# Patient Record
Sex: Male | Born: 1975 | Race: White | Hispanic: No | Marital: Married | Smoking: Former smoker
Health system: Southern US, Community
[De-identification: ages and names within clinical notes are randomized; demographics above are authoritative.]

## PROBLEM LIST (undated history)

## (undated) DIAGNOSIS — B019 Varicella without complication: Secondary | ICD-10-CM

## (undated) DIAGNOSIS — K589 Irritable bowel syndrome without diarrhea: Secondary | ICD-10-CM

## (undated) DIAGNOSIS — E785 Hyperlipidemia, unspecified: Secondary | ICD-10-CM

## (undated) DIAGNOSIS — G8929 Other chronic pain: Secondary | ICD-10-CM

## (undated) DIAGNOSIS — R011 Cardiac murmur, unspecified: Secondary | ICD-10-CM

## (undated) DIAGNOSIS — I4891 Unspecified atrial fibrillation: Secondary | ICD-10-CM

## (undated) DIAGNOSIS — M25579 Pain in unspecified ankle and joints of unspecified foot: Secondary | ICD-10-CM

## (undated) HISTORY — DX: Unspecified atrial fibrillation: I48.91

## (undated) HISTORY — DX: Cardiac murmur, unspecified: R01.1

## (undated) HISTORY — DX: Varicella without complication: B01.9

## (undated) HISTORY — DX: Other chronic pain: G89.29

## (undated) HISTORY — DX: Hyperlipidemia, unspecified: E78.5

## (undated) HISTORY — DX: Irritable bowel syndrome, unspecified: K58.9

## (undated) HISTORY — DX: Pain in unspecified ankle and joints of unspecified foot: M25.579

---
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
## 2015-03-02 ENCOUNTER — Encounter: Payer: Self-pay | Admitting: Primary Care

## 2015-03-02 ENCOUNTER — Ambulatory Visit (INDEPENDENT_AMBULATORY_CARE_PROVIDER_SITE_OTHER): Payer: BLUE CROSS/BLUE SHIELD | Admitting: Primary Care

## 2015-03-02 VITALS — BP 118/76 | HR 71 | Temp 98.0°F | Ht 71.0 in | Wt 237.8 lb

## 2015-03-02 DIAGNOSIS — Z7189 Other specified counseling: Secondary | ICD-10-CM | POA: Diagnosis not present

## 2015-03-02 DIAGNOSIS — Z7689 Persons encountering health services in other specified circumstances: Secondary | ICD-10-CM

## 2015-03-02 NOTE — Patient Instructions (Signed)
Please schedule a physical with me within the next 3 months. You may also schedule a lab only appointment 3-4 days prior. We will discuss your lab results in detail during your physical.  Your arm pain is likely caused by repetitive movement. The tendons can become inflamed causing irritation. Rest and try ibuprofen for pain and inflammation. Take 600 mg of ibuprofen three times daily as needed. Please notify me if pain is not improved or becomes worse.  It was a pleasure to meet you today! Please don't hesitate to call me with any questions. Welcome to [facility]!

## 2015-03-02 NOTE — Progress Notes (Signed)
Pre visit review using our clinic review tool, if applicable. No additional management support is needed unless otherwise documented below in the visit note. 

## 2015-03-02 NOTE — Progress Notes (Signed)
Subjective:    Patient ID: Stephen Sweeney, male    DOB: 1975-08-29, 40 y.o.   MRN: [id number]  HPI  Stephen Sweeney is a 40 year old male who presents today to establish care and discuss the problems mentioned below. Will obtain old records. His last physical was several years ago.   1) Heart Arrhythmia: 1 episode of Atrial Fibrillation 8 years ago. Diagnosed by prior PCP which showed atrial fibrillation on ECG. He has since stopped smoking, he does drink soda (overall reduction), and has reduced his coffee consumption to two large cups per day (from 2 pots daily). Also with heart murmur that was diagnosed during his Atrial Fibrillation years ago.  2) Arm Pain: Located to right anterior forearm. He notices his pain mostly after placing his arm in a certain position, usually in the flexed position, for prolong periods of time (playing with his phone, using the mouse on his computer). He mostly notices the pain when he extends the arm after long periods of flexion.   Denies numbness/tingling, recent injury or trauma. His pain has been present the past 2-3 months. He's used a TENS unit without improvement. He does take occasional aleve with slight reduction in his pain.   Review of Systems  Constitutional: Negative for unexpected weight change.  HENT: Negative for rhinorrhea.   Respiratory: Negative for cough and shortness of breath.   Cardiovascular: Negative for chest pain and palpitations.  Gastrointestinal: Negative for diarrhea and constipation.  Musculoskeletal:       Right arm pain, stiffness to pain  Neurological: Negative for dizziness, numbness and headaches.  Psychiatric/Behavioral:       Denies concerns for anxiety or depression       Past Medical History  Diagnosis Date  . Heart murmur   . Chickenpox   . Atrial fibrillation (HCC)   . IBS (irritable bowel syndrome)     Mixed    Social History   Social History  . Marital Status: Married    Spouse Name: N/A  . Number of  Children: N/A  . Years of Education: N/A   Occupational History  . Not on file.   Social History Main Topics  . Smoking status: Current Every Day Smoker  . Smokeless tobacco: Not on file  . Alcohol Use: 0.0 oz/week    0 Standard drinks or equivalent per week     Comment: rarely  . Drug Use: No  . Sexual Activity: Not on file   Other Topics Concern  . Not on file   Social History Narrative   Married.   2 children.   Works in Consulting civil engineer.   Enjoys playing video games.       Past Surgical History  Procedure Laterality Date  . Tonsillectomy and adenoidectomy  1985    Family History  Problem Relation Age of Onset  . Hyperlipidemia Mother   . Hypertension Mother   . Depression Mother   . Diabetes Mother   . Arthritis Father   . Hyperlipidemia Father   . Heart disease Father   . Stroke Father   . Depression Brother   . Hyperlipidemia Maternal Grandmother   . Hypertension Maternal Grandmother   . Depression Maternal Grandmother   . Hyperlipidemia Maternal Grandfather   . Stroke Maternal Grandfather   . Hypertension Maternal Grandfather   . Hyperlipidemia Paternal Grandmother   . Stroke Paternal Grandmother   . Alcohol abuse Paternal Grandfather   . Arthritis Paternal Grandfather   . Hyperlipidemia Paternal Grandfather   .  Hypertension Paternal Grandfather     No Known Allergies  No current outpatient prescriptions on file prior to visit.   No current facility-administered medications on file prior to visit.    BP 118/76 mmHg  Pulse 71  Temp(Src) 98 F (36.7 C) (Oral)  Ht  (1.803 m)  Wt 237 lb 12.8 oz (107.865 kg)  BMI 33.18 kg/m2  SpO2 98%    Objective:   Physical Exam  Constitutional: He appears well-nourished.  Cardiovascular: Normal rate and regular rhythm.   Pulmonary/Chest: Effort normal and breath sounds normal.  Musculoskeletal: Normal range of motion. He exhibits no edema.  Skin: Skin is warm and dry.  Psychiatric: He has a normal mood and  affect.          Assessment & Plan:  Arm Pain:  Suspect tendonitis. Located to anterior forearm, only after extension after long periods of flexing (hold phone, etc.) Good ROM, mildly tender upon palpation, no erythema. Will trial short term NSAIDS and have patient rest forearm if possible. He will notify me if symptoms become worse or do not improvement

## 2015-03-21 ENCOUNTER — Other Ambulatory Visit: Payer: Self-pay | Admitting: Primary Care

## 2015-03-21 ENCOUNTER — Other Ambulatory Visit (INDEPENDENT_AMBULATORY_CARE_PROVIDER_SITE_OTHER): Payer: BLUE CROSS/BLUE SHIELD

## 2015-03-21 DIAGNOSIS — Z0000 Encounter for general adult medical examination without abnormal findings: Secondary | ICD-10-CM | POA: Diagnosis not present

## 2015-03-21 LAB — COMPREHENSIVE METABOLIC PANEL
ALT: 62 U/L — AB (ref 0–53)
AST: 31 U/L (ref 0–37)
Albumin: 4.8 g/dL (ref 3.5–5.2)
Alkaline Phosphatase: 49 U/L (ref 39–117)
BILIRUBIN TOTAL: 0.9 mg/dL (ref 0.2–1.2)
BUN: 17 mg/dL (ref 6–23)
CHLORIDE: 104 meq/L (ref 96–112)
CO2: 28 meq/L (ref 19–32)
Calcium: 9.9 mg/dL (ref 8.4–10.5)
Creatinine, Ser: 1.05 mg/dL (ref 0.40–1.50)
GFR: 82.67 mL/min (ref >60.00)
GLUCOSE: 90 mg/dL (ref 70–99)
Potassium: 4.6 mEq/L (ref 3.5–5.1)
Sodium: 140 mEq/L (ref 135–145)
Total Protein: 7.3 g/dL (ref 6.0–8.3)

## 2015-03-21 LAB — LIPID PANEL
Cholesterol: 210 mg/dL — ABNORMAL HIGH (ref 0–200)
HDL: 41.1 mg/dL (ref >39.00)
LDL CALC: 130 mg/dL — AB (ref 0–99)
NONHDL: 168.63
Total CHOL/HDL Ratio: 5
Triglycerides: 194 mg/dL — ABNORMAL HIGH (ref 0.0–149.0)
VLDL: 38.8 mg/dL (ref 0.0–40.0)

## 2015-03-21 LAB — HEMOGLOBIN A1C: Hgb A1c MFr Bld: 4.9 % (ref 4.6–6.5)

## 2015-03-27 ENCOUNTER — Encounter: Payer: Self-pay | Admitting: Primary Care

## 2015-03-27 ENCOUNTER — Ambulatory Visit (INDEPENDENT_AMBULATORY_CARE_PROVIDER_SITE_OTHER): Payer: BLUE CROSS/BLUE SHIELD | Admitting: Primary Care

## 2015-03-27 VITALS — BP 120/78 | HR 68 | Temp 98.0°F | Ht 71.0 in | Wt 239.1 lb

## 2015-03-27 DIAGNOSIS — Z0000 Encounter for general adult medical examination without abnormal findings: Secondary | ICD-10-CM | POA: Diagnosis not present

## 2015-03-27 DIAGNOSIS — E785 Hyperlipidemia, unspecified: Secondary | ICD-10-CM

## 2015-03-27 DIAGNOSIS — Z23 Encounter for immunization: Secondary | ICD-10-CM

## 2015-03-27 DIAGNOSIS — R945 Abnormal results of liver function studies: Secondary | ICD-10-CM

## 2015-03-27 DIAGNOSIS — R7989 Other specified abnormal findings of blood chemistry: Secondary | ICD-10-CM

## 2015-03-27 NOTE — Progress Notes (Signed)
Pre visit review using our clinic review tool, if applicable. No additional management support is needed unless otherwise documented below in the visit note. 

## 2015-03-27 NOTE — Addendum Note (Signed)
Addended by: Tawnya CrookSAMBATH, Corry Ihnen on: 03/27/2015 09:17 AM   Modules accepted: Orders, SmartSet

## 2015-03-27 NOTE — Assessment & Plan Note (Signed)
Td due, provided today. Too late for flu. Discussed the importance of a healthy diet and regular exercise in order for weight loss and to reduce risk of other medical diseases.  Labs with hyperlipidemia that's discussed elsewhere.  Exam unremarkable.  Will have him schedule lab only appointment in 6 months for re-evaluation of lipids and LFT's.  Follow up in 1 year for repeat physical.

## 2015-03-27 NOTE — Patient Instructions (Signed)
It is important that you improve your diet. Please limit fast food, fatty foods, sodas, etc. Increase your consumption of fresh fruits and vegetables.  You need to consume about 2 liters (64 ounces) of water daily.  You've been provided with a tetanus vaccination that will cover you for 10 years.  Start exercising. You should be getting 1 hour of moderate intensity exercise 5 days weekly.  Increase Fish Oil to 3000 mg daily to help lower triglycerides.   Schedule a lab only appointment in 6 months for re-evaluation of cholesterol and a liver function.   Follow up in 1 year for repeat physical, or sooner if needed.  It was a pleasure to see you today!  Fat and Cholesterol Restricted Diet High levels of fat and cholesterol in your blood may lead to various health problems, such as diseases of the heart, blood vessels, gallbladder, liver, and pancreas. Fats are concentrated sources of energy that come in various forms. Certain types of fat, including saturated fat, may be harmful in excess. Cholesterol is a substance needed by your body in small amounts. Your body makes all the cholesterol it needs. Excess cholesterol comes from the food you eat. When you have high levels of cholesterol and saturated fat in your blood, health problems can develop because the excess fat and cholesterol will gather along the walls of your blood vessels, causing them to narrow. Choosing the right foods will help you control your intake of fat and cholesterol. This will help keep the levels of these substances in your blood within normal limits and reduce your risk of disease. WHAT IS MY PLAN? Your health care provider recommends that you:  Get no more than __________ % of the total calories in your daily diet from fat.  Limit your intake of saturated fat to less than ______% of your total calories each day.  Limit the amount of cholesterol in your diet to less than _________mg per day. WHAT TYPES OF FAT SHOULD I  CHOOSE?  Choose healthy fats more often. Choose monounsaturated and polyunsaturated fats, such as olive and canola oil, flaxseeds, walnuts, almonds, and seeds.  Eat more omega-3 fats. Good choices include salmon, mackerel, sardines, tuna, flaxseed oil, and ground flaxseeds. Aim to eat fish at least two times a week.  Limit saturated fats. Saturated fats are primarily found in animal products, such as meats, butter, and cream. Plant sources of saturated fats include palm oil, palm kernel oil, and coconut oil.  Avoid foods with partially hydrogenated oils in them. These contain trans fats. Examples of foods that contain trans fats are stick margarine, some tub margarines, cookies, crackers, and other baked goods. WHAT GENERAL GUIDELINES DO I NEED TO FOLLOW? These guidelines for healthy eating will help you control your intake of fat and cholesterol:  Check food labels carefully to identify foods with trans fats or high amounts of saturated fat.  Fill one half of your plate with vegetables and green salads.  Fill one fourth of your plate with whole grains. Look for the word "whole" as the first word in the ingredient list.  Fill one fourth of your plate with lean protein foods.  Limit fruit to two servings a day. Choose fruit instead of juice.  Eat more foods that contain soluble fiber. Examples of foods that contain this type of fiber are apples, broccoli, carrots, beans, peas, and barley. Aim to get 20-30 g of fiber per day.  Eat more home-cooked food and less restaurant, buffet, and fast  food.  Limit or avoid alcohol.  Limit foods high in starch and sugar.  Limit fried foods.  Cook foods using methods other than frying. Baking, boiling, grilling, and broiling are all great options.  Lose weight if you are overweight. Losing just 5-10% of your initial body weight can help your overall health and prevent diseases such as diabetes and heart disease. WHAT FOODS CAN I  EAT? Grains Whole grains, such as whole wheat or whole grain breads, crackers, cereals, and pasta. Unsweetened oatmeal, bulgur, barley, quinoa, or brown rice. Corn or whole wheat flour tortillas. Vegetables Fresh or frozen vegetables (raw, steamed, roasted, or grilled). Green salads. Fruits All fresh, canned (in natural juice), or frozen fruits. Meat and Other Protein Products Ground beef (85% or leaner), grass-fed beef, or beef trimmed of fat. Skinless chicken or [place]. Ground chicken or [place]. Pork trimmed of fat. All fish and seafood. Eggs. Dried beans, peas, or lentils. Unsalted nuts or seeds. Unsalted canned or dry beans. Dairy Low-fat dairy products, such as skim or 1% milk, 2% or reduced-fat cheeses, low-fat ricotta or cottage cheese, or plain low-fat yogurt. Fats and Oils Tub margarines without trans fats. Light or reduced-fat mayonnaise and salad dressings. Avocado. Olive, canola, sesame, or safflower oils. Natural peanut or almond butter (choose ones without added sugar and oil). The items listed above may not be a complete list of recommended foods or beverages. Contact your dietitian for more options. WHAT FOODS ARE NOT RECOMMENDED? Grains White bread. White pasta. White rice. Cornbread. Bagels, pastries, and croissants. Crackers that contain trans fat. Vegetables White potatoes. Corn. Creamed or fried vegetables. Vegetables in a cheese sauce. Fruits Dried fruits. Canned fruit in light or heavy syrup. Fruit juice. Meat and Other Protein Products Fatty cuts of meat. Ribs, chicken wings, bacon, sausage, bologna, salami, chitterlings, fatback, hot dogs, bratwurst, and packaged luncheon meats. Liver and organ meats. Dairy Whole or 2% milk, cream, half-and-half, and cream cheese. Whole milk cheeses. Whole-fat or sweetened yogurt. Full-fat cheeses. Nondairy creamers and whipped toppings. Processed cheese, cheese spreads, or cheese curds. Sweets and Desserts Corn syrup, sugars,  honey, and molasses. Candy. Jam and jelly. Syrup. Sweetened cereals. Cookies, pies, cakes, donuts, muffins, and ice cream. Fats and Oils Butter, stick margarine, lard, shortening, ghee, or bacon fat. Coconut, palm kernel, or palm oils. Beverages Alcohol. Sweetened drinks (such as sodas, lemonade, and fruit drinks or punches). The items listed above may not be a complete list of foods and beverages to avoid. Contact your dietitian for more information.   This information is not intended to replace advice given to you by your health care provider. Make sure you discuss any questions you have with your health care provider.   Document Released: 12/24/2004 Document Revised: 01/14/2014 Document Reviewed: 03/24/2013 Elsevier Interactive Patient Education [employer]

## 2015-03-27 NOTE — Progress Notes (Signed)
Subjective:    Patient ID: Stephen Sweeney, male    DOB: August 28, 1975, 40 y.o.   MRN: [id number]  HPI  Stephen Sweeney is a 40 year old who presents today for complete physical.  Immunizations: -Tetanus: Unsure, believes it's been over 10 years.  -Influenza: Did not receive last season.    Diet: Endorses a fair diet. Breakfast: Skips Lunch: Salad, wrap, fast food, chili Dinner: Pasta, casseroles, lean meat, occasional vegetables Snacks: Chips, candy, doughnut  Desserts: Eats 4 times weekly Beverages: Coffee, soda, unsweet tea, limited water.   Exercise: He is not currently exercising, but plans on walking more at work.  Eye exam: Completed in May 2016 Dental exam: Completed in March 2017     Review of Systems  Constitutional: Negative for unexpected weight change.  HENT: Negative for rhinorrhea.   Respiratory: Negative for cough and shortness of breath.   Cardiovascular: Negative for chest pain.  Gastrointestinal: Negative for diarrhea and constipation.  Genitourinary: Negative for difficulty urinating.  Musculoskeletal:       Right forearm pain. Overuse of right forearm.   Skin: Negative for rash.  Allergic/Immunologic: Negative for immunocompromised state.  Neurological: Negative for dizziness, numbness and headaches.  Psychiatric/Behavioral:       Denies concerns for anxiety or depression.        Past Medical History  Diagnosis Date  . Heart murmur   . Chickenpox   . Atrial fibrillation (HCC)     once, diagnosed on ECG  . IBS (irritable bowel syndrome)     Mixed    Social History   Social History  . Marital Status: Married    Spouse Name: N/A  . Number of Children: N/A  . Years of Education: N/A   Occupational History  . Not on file.   Social History Main Topics  . Smoking status: Former Games developermoker  . Smokeless tobacco: Not on file  . Alcohol Use: 0.0 oz/week    0 Standard drinks or equivalent per week     Comment: rarely  . Drug Use: No  . Sexual  Activity: Not on file   Other Topics Concern  . Not on file   Social History Narrative   Married.   2 children.   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       Past Surgical History  Procedure Laterality Date  . Tonsillectomy and adenoidectomy  1985    Family History  Problem Relation Age of Onset  . Hyperlipidemia Mother   . Hypertension Mother   . Depression Mother   . Diabetes Mother   . Arthritis Father   . Hyperlipidemia Father   . Heart disease Father   . Stroke Father   . Depression Brother   . Hyperlipidemia Maternal Grandmother   . Hypertension Maternal Grandmother   . Depression Maternal Grandmother   . Hyperlipidemia Maternal Grandfather   . Stroke Maternal Grandfather   . Hypertension Maternal Grandfather   . Hyperlipidemia Paternal Grandmother   . Stroke Paternal Grandmother   . Alcohol abuse Paternal Grandfather   . Arthritis Paternal Grandfather   . Hyperlipidemia Paternal Grandfather   . Hypertension Paternal Grandfather     No Known Allergies  Current Outpatient Prescriptions on File Prior to Visit  Medication Sig Dispense Refill  . fexofenadine (ALLEGRA) 180 MG tablet Take 180 mg by mouth daily.    . Fish Oil OIL Take 2,000 mg by mouth daily.    . Multiple Vitamin (MULTIVITAMIN) capsule Take 1  capsule by mouth daily.     No current facility-administered medications on file prior to visit.    BP 120/78 mmHg  Pulse 68  Temp(Src) 98 F (36.7 C) (Oral)  Ht  (1.803 m)  Wt 239 lb 1.9 oz (108.464 kg)  BMI 33.37 kg/m2  SpO2 97%    Objective:   Physical Exam  Constitutional: He is oriented to person, place, and time. He appears well-nourished.  HENT:  Right Ear: Tympanic membrane and ear canal normal.  Left Ear: Tympanic membrane and ear canal normal.  Nose: Nose normal. Right sinus exhibits no maxillary sinus tenderness and no frontal sinus tenderness. Left sinus exhibits no maxillary sinus tenderness and no frontal sinus  tenderness.  Mouth/Throat: Oropharynx is clear and moist.  Eyes: Conjunctivae and EOM are normal. Pupils are equal, round, and reactive to light.  Neck: Neck supple. No thyromegaly present.  Cardiovascular: Normal rate, regular rhythm and normal heart sounds.   Pulmonary/Chest: Effort normal and breath sounds normal. He has no wheezes. He has no rales.  Abdominal: Soft. Bowel sounds are normal. There is no tenderness.  Musculoskeletal: Normal range of motion.  Neurological: He is alert and oriented to person, place, and time. He has normal reflexes. No cranial nerve deficit.  Skin: Skin is warm and dry.  Psychiatric: He has a normal mood and affect.          Assessment & Plan:

## 2015-03-27 NOTE — Assessment & Plan Note (Signed)
TC and trigs above goal. LDL borderline. Will give him an opportunity to improve his diet and start exercising. Increase fish oil to 1000 mg TID with meals. Repeat in 6 months, if no improvement will consider low dose statin.

## 2015-05-30 ENCOUNTER — Encounter: Payer: BLUE CROSS/BLUE SHIELD | Admitting: Primary Care

## 2015-06-22 ENCOUNTER — Encounter: Payer: Self-pay | Admitting: Primary Care

## 2015-06-22 ENCOUNTER — Ambulatory Visit
Admission: RE | Admit: 2015-06-22 | Discharge: 2015-06-22 | Disposition: A | Discharge status: Home / Self Care | Payer: BLUE CROSS/BLUE SHIELD | Source: Ambulatory Visit | Attending: Primary Care | Admitting: Primary Care

## 2015-06-22 ENCOUNTER — Ambulatory Visit (INDEPENDENT_AMBULATORY_CARE_PROVIDER_SITE_OTHER)
Admission: RE | Admit: 2015-06-22 | Discharge: 2015-06-22 | Disposition: A | Discharge status: Home / Self Care | Payer: BLUE CROSS/BLUE SHIELD | Source: Ambulatory Visit | Attending: Primary Care | Admitting: Primary Care

## 2015-06-22 ENCOUNTER — Ambulatory Visit (INDEPENDENT_AMBULATORY_CARE_PROVIDER_SITE_OTHER): Payer: BLUE CROSS/BLUE SHIELD | Admitting: Primary Care

## 2015-06-22 VITALS — BP 122/78 | HR 70 | Temp 98.1°F | Ht 71.0 in | Wt 236.8 lb

## 2015-06-22 DIAGNOSIS — M25521 Pain in right elbow: Secondary | ICD-10-CM | POA: Diagnosis not present

## 2015-06-22 DIAGNOSIS — M25529 Pain in unspecified elbow: Secondary | ICD-10-CM

## 2015-06-22 DIAGNOSIS — M25522 Pain in left elbow: Secondary | ICD-10-CM | POA: Diagnosis not present

## 2015-06-22 MED ORDER — DICLOFENAC SODIUM 50 MG PO TBEC: 50.0000 mg | DELAYED_RELEASE_TABLET | Freq: Two times a day (BID) | ORAL | Status: DC | PRN

## 2015-06-22 NOTE — Progress Notes (Signed)
Subjective:    Patient ID: Stephen Sweeney, male    DOB: 14-Jul-1975, 40 y.o.   MRN: [id number]  HPI  Stephen Sweeney is a 40 year old male who presents today with a chief complaint of joint aches. His discomfort has been present to his bilateral elbows for the past several months. He works at [employer] desk and uses a mouse with his computer all day. His discomfort his worse with extension of his elbows. Over the past several weeks his pain will wake him up from sleep. Denies FH of inflammatory disorders. Denies swelling, numbness/tingling, recent injury/trauma. He's been taking Aleve with some improvement, but continues to experience pain.   Review of Systems  Musculoskeletal: Positive for arthralgias. Negative for myalgias and joint swelling.  Skin: Negative for color change.  Neurological: Negative for numbness.       Past Medical History  Diagnosis Date  . Heart murmur   . Chickenpox   . Atrial fibrillation (HCC)     once, diagnosed on ECG  . IBS (irritable bowel syndrome)     Mixed     Social History   Social History  . Marital Status: Married    Spouse Name: N/A  . Number of Children: N/A  . Years of Education: N/A   Occupational History  . Not on file.   Social History Main Topics  . Smoking status: Former Games developermoker  . Smokeless tobacco: Not on file  . Alcohol Use: 0.0 oz/week    0 Standard drinks or equivalent per week     Comment: rarely  . Drug Use: No  . Sexual Activity: Not on file   Other Topics Concern  . Not on file   Social History Narrative   Married.   2 children.   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       Past Surgical History  Procedure Laterality Date  . Tonsillectomy and adenoidectomy  1985    Family History  Problem Relation Age of Onset  . Hyperlipidemia Mother   . Hypertension Mother   . Depression Mother   . Diabetes Mother   . Arthritis Father   . Hyperlipidemia Father   . Heart disease Father   . Stroke Father   . Depression  Brother   . Hyperlipidemia Maternal Grandmother   . Hypertension Maternal Grandmother   . Depression Maternal Grandmother   . Hyperlipidemia Maternal Grandfather   . Stroke Maternal Grandfather   . Hypertension Maternal Grandfather   . Hyperlipidemia Paternal Grandmother   . Stroke Paternal Grandmother   . Alcohol abuse Paternal Grandfather   . Arthritis Paternal Grandfather   . Hyperlipidemia Paternal Grandfather   . Hypertension Paternal Grandfather     No Known Allergies  Current Outpatient Prescriptions on File Prior to Visit  Medication Sig Dispense Refill  . fexofenadine (ALLEGRA) 180 MG tablet Take 180 mg by mouth daily.    . Fish Oil OIL Take 2,000 mg by mouth daily.    . Multiple Vitamin (MULTIVITAMIN) capsule Take 1 capsule by mouth daily.     No current facility-administered medications on file prior to visit.    BP 122/78 mmHg  Pulse 70  Temp(Src) 98.1 F (36.7 C) (Oral)  Ht 5\' 11"  (1.803 m)  Wt 236 lb 12.8 oz (107.412 kg)  BMI 33.04 kg/m2  SpO2 98%    Objective:   Physical Exam  Constitutional: He appears well-nourished.  Cardiovascular: Normal rate and regular rhythm.   Pulmonary/Chest: Effort normal  and breath sounds normal.  Musculoskeletal:       Right elbow: He exhibits normal range of motion and no swelling. Tenderness found. Medial epicondyle and lateral epicondyle tenderness noted.       Left elbow: He exhibits normal range of motion and no swelling. Tenderness found. Medial epicondyle and lateral epicondyle tenderness noted.  Skin: Skin is warm and dry.          Assessment & Plan:  Joint Aches:  Located to bilateral elbows for the past several months, worse over past several weeks. Pain now waking patient from sleep. Slight improvement with Aleve. No recent trauma, but does use elbows repetitively with a computer and mouse for work. Exam with tenderness to epicondyles bilaterally, good ROM without crepitus or swelling. Will obtain xrays  today to rule out any abnormality . Will have him see Dr. Patsy Lager for further evaluation. Rx for Diclofenac sent to pharmacy.

## 2015-06-22 NOTE — Patient Instructions (Addendum)
Complete xray(s) prior to leaving today. I will notify you of your results once received.  Schedule an appointment with Dr. Karleen HampshireSpencer Copland for further evaluation and treatment.   You may take Diclofenac tablets twice daily as needed for pain. Do not take any aleve or ibuprofen with this medication.   It was a pleasure to see you today!

## 2015-06-22 NOTE — Progress Notes (Signed)
Pre visit review using our clinic review tool, if applicable. No additional management support is needed unless otherwise documented below in the visit note. 

## 2015-06-29 ENCOUNTER — Ambulatory Visit (INDEPENDENT_AMBULATORY_CARE_PROVIDER_SITE_OTHER): Payer: BLUE CROSS/BLUE SHIELD | Admitting: Family Medicine

## 2015-06-29 ENCOUNTER — Encounter: Payer: Self-pay | Admitting: Family Medicine

## 2015-06-29 VITALS — BP 106/70 | HR 61 | Temp 98.2°F | Ht 71.0 in | Wt 235.5 lb

## 2015-06-29 DIAGNOSIS — M7712 Lateral epicondylitis, left elbow: Secondary | ICD-10-CM | POA: Diagnosis not present

## 2015-06-29 DIAGNOSIS — M7711 Lateral epicondylitis, right elbow: Secondary | ICD-10-CM

## 2015-06-29 MED ORDER — NITROGLYCERIN 0.2 MG/HR TD PT24: MEDICATED_PATCH | TRANSDERMAL | Status: DC

## 2015-06-29 NOTE — Patient Instructions (Signed)

## 2015-06-29 NOTE — Progress Notes (Signed)
Dr. Karleen Hampshire T. Winfield Caba, MD, CAQ Sports Medicine Primary Care and Sports Medicine [address] [place], [address] Phone: [phone] Fax: [phone]  06/29/2015  Patient: Stephen Sweeney, MRN: [id number], DOB: [birth year], 40 y.o.  Primary Physician:  Morrie Sheldon, NP   Chief Complaint  Patient presents with  . Arm Pain    Bilateral x 1 year   Subjective:   Stephen Sweeney presents with lateral elbow pain.  Length of symptoms: L = 6 mo, R = 1 year Hand effected: B  Patient describes a dull ache on the lateral elbow. There is some translation in the proximal forearm and in the distal upper arm. It is painful to lift with the hand facing down and to lift with the thumb in an upright position. Supination is painful. Patient points to the lateral epicondyle as the point of maximal tenderness near ECRB.  Elbow vs forearm. Has had some steroid shots in both of them - MD in [place], [place].  Has had NCV studies on the right arm.  "Joint" feels bruised. XR were normal.   LE hurts. Muscle will hurt.  B LE.  Has used a LE band.   No trauma.   No prior fractures or operative interventions in the effective hand. Prior PT or HEP: none  Denies numbness or tingling. No significant neck or shoulder pain.  Hand of dominance: LEFT  The PMH, PSH, Social History, Family History, Medications, and allergies have been reviewed in [facility], and have been updated if relevant.  Patient Active Problem List   Diagnosis Date Noted  . Preventative health care 03/27/2015  . Hyperlipidemia 03/27/2015    Past Medical History  Diagnosis Date  . Heart murmur   . Chickenpox   . Atrial fibrillation (HCC)     once, diagnosed on ECG  . IBS (irritable bowel syndrome)     Mixed    Past Surgical History  Procedure Laterality Date  . Tonsillectomy and adenoidectomy  1985    Social History   Social History  . Marital Status: Married    Spouse Name: N/A  . Number of Children: N/A   . Years of Education: N/A   Occupational History  . Not on file.   Social History Main Topics  . Smoking status: Former Games developer  . Smokeless tobacco: Never Used  . Alcohol Use: 0.0 oz/week    0 Standard drinks or equivalent per week     Comment: rarely  . Drug Use: No  . Sexual Activity: Not on file   Other Topics Concern  . Not on file   Social History Narrative   Married.   2 children.   Works in Consulting civil engineer.   Enjoys playing video games.       Family History  Problem Relation Age of Onset  . Hyperlipidemia Mother   . Hypertension Mother   . Depression Mother   . Diabetes Mother   . Arthritis Father   . Hyperlipidemia Father   . Heart disease Father   . Stroke Father   . Depression Brother   . Hyperlipidemia Maternal Grandmother   . Hypertension Maternal Grandmother   . Depression Maternal Grandmother   . Hyperlipidemia Maternal Grandfather   . Stroke Maternal Grandfather   . Hypertension Maternal Grandfather   . Hyperlipidemia Paternal Grandmother   . Stroke Paternal Grandmother   . Alcohol abuse Paternal Grandfather   . Arthritis Paternal Grandfather   . Hyperlipidemia Paternal Grandfather   . Hypertension Paternal Grandfather  No Known Allergies  Medication list reviewed and updated in full in [facility].  GEN: No fevers, chills. Nontoxic. Primarily MSK c/o today. MSK: Detailed in the HPI GI: tolerating PO intake without difficulty Neuro: No numbness, parasthesias, or tingling associated. Otherwise the pertinent positives of the ROS are noted above.   Objective:   Blood pressure 106/70, pulse 61, temperature 98.2 F (36.8 C), temperature source Oral, height 5\' 11"  (1.803 m), weight 235 lb 8 oz (106.822 kg).  GEN: Well-developed,well-nourished,in no acute distress; alert,appropriate and cooperative throughout examination HEENT: Normocephalic and atraumatic without obvious abnormalities. Ears, externally no deformities PULM: Breathing comfortably  in no respiratory distress EXT: No clubbing, cyanosis, or edema PSYCH: Normally interactive. Cooperative during the interview. Pleasant. Friendly and conversant. Not anxious or depressed appearing. Normal, full affect.  B elbow Ecchymosis or edema: neg ROM: full flexion, extension, pronation, supination Shoulder ROM: Full Flexion: 5/5 Extension: 4/5, PAINFUL, L > R Supination: 5/5, PAINFUL Pronation: 5/5 Wrist ext: 5/5 Wrist flexion: 5/5 No gross bony abnormality Varus and Valgus stress: stable ECRB tenderness: YES, TTP Medial epicondyle: NT Lateral epicondyle, resisted wrist extension from wrist full pronation and flexion: PAINFUL grip: 5/5  sensation intact Tinel's, Elbow: negative  Subjective:   Lateral epicondylitis of both elbows - Plan: Ambulatory referral to Physical Therapy  Recalcitrant tennis elbow L > R in a IT prof and gamer. Elbow anatomy was reviewed, and tendinopathy was explained.  Pt[facility] on elbow rehabiliation. Rice/beans program. Series of concentric and eccentric exercises should be done starting with no weight, work up to 1 lb, hammer, etc.  Use counterforce strap if working or using hands.  Formal PT would be beneficial. Emphasized stretching an cross-friction massage Emphasized proper palms up lifting biomechanics to unload ECRB  NTG protocol  Follow-up: Return in about 6 weeks (around 08/10/2015).  New Prescriptions   NITROGLYCERIN (NITRODUR - DOSED IN MG/24 HR) 0.2 MG/HR PATCH    Apply 1/4 of a patch to the affected area and change every 24 hours (re: tendinopathy)   Orders Placed This Encounter  Procedures  . Ambulatory referral to Physical Therapy   Patient Instructions  Nitroglycerin Protocol   Apply 1/4 nitroglycerin patch to affected area daily.  Change position of patch within the affected area every 24 hours.  You may experience a headache during the first 1-2 weeks of using the patch,  these should subside.  If you experience headaches after beginning nitroglycerin patch treatment, you may take your preferred over the counter pain reliever.  Another side effect of the nitroglycerin patch is skin irritation or rash related to patch adhesive.  Please notify our office if you develop more severe headaches or rash, and stop the patch.  Tendon healing with nitroglycerin patch may require 12 to 24 weeks depending on the extent of injury.  Men should not use if taking Viagra, Cialis, or Levitra.   Do not use if you have migraines or rosacea.       Signed,  Elpidio GaleaSpencer T. Vi Whitesel, MD   Patient's Medications  New Prescriptions   NITROGLYCERIN (NITRODUR - DOSED IN MG/24 HR) 0.2 MG/HR PATCH    Apply 1/4 of a patch to the affected area and change every 24 hours (re: tendinopathy)  Previous Medications   DICLOFENAC (VOLTAREN) 50 MG EC TABLET    Take 1 tablet (50 mg total) by mouth 2 (two) times daily as needed for moderate pain.   FEXOFENADINE (ALLEGRA) 180 MG TABLET  Take 180 mg by mouth daily.   FISH OIL OIL    Take 2,000 mg by mouth daily.   MULTIPLE VITAMIN (MULTIVITAMIN) CAPSULE    Take 1 capsule by mouth daily.  Modified Medications   No medications on file  Discontinued Medications   No medications on file

## 2015-06-29 NOTE — Progress Notes (Signed)
Pre visit review using our clinic review tool, if applicable. No additional management support is needed unless otherwise documented below in the visit note. 

## 2015-07-07 DIAGNOSIS — M7711 Lateral epicondylitis, right elbow: Secondary | ICD-10-CM | POA: Diagnosis not present

## 2015-07-07 DIAGNOSIS — M7712 Lateral epicondylitis, left elbow: Secondary | ICD-10-CM | POA: Diagnosis not present

## 2015-07-10 DIAGNOSIS — M7712 Lateral epicondylitis, left elbow: Secondary | ICD-10-CM | POA: Diagnosis not present

## 2015-07-10 DIAGNOSIS — M7711 Lateral epicondylitis, right elbow: Secondary | ICD-10-CM | POA: Diagnosis not present

## 2015-07-14 DIAGNOSIS — M7711 Lateral epicondylitis, right elbow: Secondary | ICD-10-CM | POA: Diagnosis not present

## 2015-07-14 DIAGNOSIS — M7712 Lateral epicondylitis, left elbow: Secondary | ICD-10-CM | POA: Diagnosis not present

## 2015-07-17 DIAGNOSIS — M7712 Lateral epicondylitis, left elbow: Secondary | ICD-10-CM | POA: Diagnosis not present

## 2015-07-17 DIAGNOSIS — M7711 Lateral epicondylitis, right elbow: Secondary | ICD-10-CM | POA: Diagnosis not present

## 2015-07-20 DIAGNOSIS — M7711 Lateral epicondylitis, right elbow: Secondary | ICD-10-CM | POA: Diagnosis not present

## 2015-07-20 DIAGNOSIS — M7712 Lateral epicondylitis, left elbow: Secondary | ICD-10-CM | POA: Diagnosis not present

## 2015-07-26 DIAGNOSIS — M7712 Lateral epicondylitis, left elbow: Secondary | ICD-10-CM | POA: Diagnosis not present

## 2015-07-26 DIAGNOSIS — M6281 Muscle weakness (generalized): Secondary | ICD-10-CM | POA: Diagnosis not present

## 2015-07-26 DIAGNOSIS — M79632 Pain in left forearm: Secondary | ICD-10-CM | POA: Diagnosis not present

## 2015-07-26 DIAGNOSIS — M7711 Lateral epicondylitis, right elbow: Secondary | ICD-10-CM | POA: Diagnosis not present

## 2015-07-26 DIAGNOSIS — M2560 Stiffness of unspecified joint, not elsewhere classified: Secondary | ICD-10-CM | POA: Diagnosis not present

## 2015-08-04 DIAGNOSIS — M7712 Lateral epicondylitis, left elbow: Secondary | ICD-10-CM | POA: Diagnosis not present

## 2015-08-04 DIAGNOSIS — M7711 Lateral epicondylitis, right elbow: Secondary | ICD-10-CM | POA: Diagnosis not present

## 2015-08-09 ENCOUNTER — Other Ambulatory Visit: Payer: Self-pay | Admitting: Primary Care

## 2015-08-09 DIAGNOSIS — M25529 Pain in unspecified elbow: Secondary | ICD-10-CM

## 2015-08-09 MED ORDER — DICLOFENAC SODIUM 50 MG PO TBEC: 50.0000 mg | DELAYED_RELEASE_TABLET | Freq: Two times a day (BID) | ORAL | 0 refills | Status: DC | PRN

## 2015-08-10 ENCOUNTER — Ambulatory Visit: Payer: BLUE CROSS/BLUE SHIELD | Admitting: Primary Care

## 2015-08-16 ENCOUNTER — Ambulatory Visit (INDEPENDENT_AMBULATORY_CARE_PROVIDER_SITE_OTHER): Payer: BLUE CROSS/BLUE SHIELD | Admitting: Family Medicine

## 2015-08-16 ENCOUNTER — Ambulatory Visit (INDEPENDENT_AMBULATORY_CARE_PROVIDER_SITE_OTHER)
Admission: RE | Admit: 2015-08-16 | Discharge: 2015-08-16 | Disposition: A | Discharge status: Home / Self Care | Payer: BLUE CROSS/BLUE SHIELD | Source: Ambulatory Visit | Attending: Family Medicine | Admitting: Family Medicine

## 2015-08-16 ENCOUNTER — Encounter: Payer: Self-pay | Admitting: Family Medicine

## 2015-08-16 VITALS — BP 110/78 | HR 88 | Temp 98.3°F | Ht 71.0 in | Wt 240.2 lb

## 2015-08-16 DIAGNOSIS — M7711 Lateral epicondylitis, right elbow: Secondary | ICD-10-CM | POA: Diagnosis not present

## 2015-08-16 DIAGNOSIS — M25572 Pain in left ankle and joints of left foot: Secondary | ICD-10-CM | POA: Diagnosis not present

## 2015-08-16 DIAGNOSIS — M7712 Lateral epicondylitis, left elbow: Secondary | ICD-10-CM

## 2015-08-16 DIAGNOSIS — S99912A Unspecified injury of left ankle, initial encounter: Secondary | ICD-10-CM | POA: Diagnosis not present

## 2015-08-16 NOTE — Progress Notes (Signed)
Pre visit review using our clinic review tool, if applicable. No additional management support is needed unless otherwise documented below in the visit note. 

## 2015-08-16 NOTE — Progress Notes (Signed)
Dr. Karleen Hampshire T. Pryor Guettler, MD, CAQ Sports Medicine Primary Care and Sports Medicine [address] [place], [address] Phone: [phone] Fax: [phone]  08/16/2015  Patient: Stephen Sweeney, MRN: [id number], DOB: 01-14-1975, 40 y.o.  Primary Physician:  Morrie Sheldon, NP   Chief Complaint  Patient presents with  . Follow-up    on left elbow  . Ankle Pain    left   Subjective:   Stephen Sweeney presents with lateral elbow pain.  F/u R and L LE 70% improving He has been very compliant with his home exercise program, and he is also done formal physical therapy.  Rolled his left ankle about a month ago, and the bruise has gone away. No bruising and swelling initially. He is walking without much difficulty with ambulation. He is having quite a bit of difficulty trying to do His another balancing on his one leg.   06/29/2015 Last OV with [name], MD  Length of symptoms: L = 6 mo, R = 1 year Hand effected: B  Patient describes a dull ache on the lateral elbow. There is some translation in the proximal forearm and in the distal upper arm. It is painful to lift with the hand facing down and to lift with the thumb in an upright position. Supination is painful. Patient points to the lateral epicondyle as the point of maximal tenderness near ECRB.  Elbow vs forearm. Has had some steroid shots in both of them - MD in [place], [place].  Has had NCV studies on the right arm.  "Joint" feels bruised. XR were normal.   LE hurts. Muscle will hurt.  B LE.  Has used a LE band.   No trauma.   No prior fractures or operative interventions in the effective hand. Prior PT or HEP: none  Denies numbness or tingling. No significant neck or shoulder pain.  Hand of dominance: LEFT  The PMH, PSH, Social History, Family History, Medications, and allergies have been reviewed in [facility], and have been updated if relevant.  Patient Active Problem List   Diagnosis Date Noted  .  Preventative health care 03/27/2015  . Hyperlipidemia 03/27/2015    Past Medical History:  Diagnosis Date  . Atrial fibrillation (HCC)    once, diagnosed on ECG  . Chickenpox   . Heart murmur   . IBS (irritable bowel syndrome)    Mixed    Past Surgical History:  Procedure Laterality Date  . TONSILLECTOMY AND ADENOIDECTOMY  1985    Social History   Social History  . Marital status: Married    Spouse name: N/A  . Number of children: N/A  . Years of education: N/A   Occupational History  . Not on file.   Social History Main Topics  . Smoking status: Former Games developer  . Smokeless tobacco: Never Used  . Alcohol use 0.0 oz/week     Comment: rarely  . Drug use: No  . Sexual activity: Not on file   Other Topics Concern  . Not on file   Social History Narrative   Married.   2 children.   Works in Consulting civil engineer.   Enjoys playing video games.       Family History  Problem Relation Age of Onset  . Hyperlipidemia Mother   . Hypertension Mother   . Depression Mother   . Diabetes Mother   . Arthritis Father   . Hyperlipidemia Father   . Heart disease Father   . Stroke Father   .  Depression Brother   . Hyperlipidemia Maternal Grandmother   . Hypertension Maternal Grandmother   . Depression Maternal Grandmother   . Hyperlipidemia Maternal Grandfather   . Stroke Maternal Grandfather   . Hypertension Maternal Grandfather   . Hyperlipidemia Paternal Grandmother   . Stroke Paternal Grandmother   . Alcohol abuse Paternal Grandfather   . Arthritis Paternal Grandfather   . Hyperlipidemia Paternal Grandfather   . Hypertension Paternal Grandfather     No Known Allergies  Medication list reviewed and updated in full in [facility].  GEN: No fevers, chills. Nontoxic. Primarily MSK c/o today. MSK: Detailed in the HPI GI: tolerating PO intake without difficulty Neuro: No numbness, parasthesias, or tingling associated. Otherwise the pertinent positives of the ROS are noted  above.   Objective:   Blood pressure 110/78, pulse 88, temperature 98.3 F (36.8 C), temperature source Oral, height 5\' 11"  (1.803 m), weight 240 lb 4 oz (109 kg).  GEN: Well-developed,well-nourished,in no acute distress; alert,appropriate and cooperative throughout examination HEENT: Normocephalic and atraumatic without obvious abnormalities. Ears, externally no deformities PULM: Breathing comfortably in no respiratory distress EXT: No clubbing, cyanosis, or edema PSYCH: Normally interactive. Cooperative during the interview. Pleasant. Friendly and conversant. Not anxious or depressed appearing. Normal, full affect.  B elbow Ecchymosis or edema: neg ROM: full flexion, extension, pronation, supination Shoulder ROM: Full Flexion: 5/5 Extension: 5/5, mildly PAINFUL, L > R Supination: 5/5 Pronation: 5/5 Wrist ext: 5/5 Wrist flexion: 5/5 No gross bony abnormality Varus and Valgus stress: stable ECRB tenderness: minimal ttp Medial epicondyle: NT Lateral epicondyle, resisted wrist extension from wrist full pronation and flexion: nt grip: 5/5  sensation intact Tinel's, Elbow: negative  ANKLE: L Echymosis: no Edema: no ROM: Full dorsi and plantar flexion, inversion, eversion Gait: heel toe, non-antalgic Anterior ankle between tib and fib Lateral Mall: NT Medial Mall: NT Talus: NT Navicular: NT Cuboid: NT Calcaneous: NT Metatarsals: NT 5th MT: NT Phalanges: NT Achilles: NT Plantar Fascia: NT Fat Pad: NT Peroneals: NT Post Tib: NT Great Toe: Nml motion Ant Drawer: neg Talar Tilt: neg ATFL: NT CFL: NT Deltoid: NT Str: 5/5 Other Special tests: none Sensation: intact   Dg Ankle Complete Left  Result Date: 08/16/2015 CLINICAL DATA:  Left ankle pain for 1 month after injury EXAM: LEFT ANKLE COMPLETE - 3+ VIEW COMPARISON:  None. FINDINGS: No acute fracture is seen. The ankle joint appears normal. Alignment is normal. There is degenerative change in the mid and hindfoot  present. IMPRESSION: Degenerative change in the mid and hindfoot.  No acute abnormality. Electronically Signed   By: Dwyane DeePaul  Barry M.D.   On: 08/16/2015 08:55     Subjective:   Left ankle pain - Plan: DG Ankle Complete Left  Lateral epicondylitis of both elbows  >25 minutes spent in face to face time with patient, >50% spent in counselling or coordination of care  No ankle pain, consistent with anterior tibiofibular ankle sprain. Mild in character. I suspect that his proprioception will take longer to improve compared to others.  Tennis elbow is improving quite a bit.  Additional time spent in discussing ankle pathology as well as x-rays and rehabilitation.  Follow-up: prn  Orders Placed This Encounter  Procedures  . DG Ankle Complete Left    Signed,  Vihana Kydd T. Kilan Banfill, MD   Patient's Medications  New Prescriptions   No medications on file  Previous Medications   DICLOFENAC (VOLTAREN) 50 MG EC TABLET    Take 1 tablet (50 mg total)  by mouth 2 (two) times daily as needed for moderate pain.   FEXOFENADINE (ALLEGRA) 180 MG TABLET    Take 180 mg by mouth daily.   FISH OIL OIL    Take 2,000 mg by mouth daily.   MULTIPLE VITAMIN (MULTIVITAMIN) CAPSULE    Take 1 capsule by mouth daily.   NITROGLYCERIN (NITRODUR - DOSED IN MG/24 HR) 0.2 MG/HR PATCH    Apply 1/4 of a patch to the affected area and change every 24 hours (re: tendinopathy)  Modified Medications   No medications on file  Discontinued Medications   No medications on file

## 2015-08-16 NOTE — Patient Instructions (Signed)
Begin with easy walking, heel, toe and backwards * Try to pick an easy location like a hallway or a room in your house and do one of these each time that you go through this area.  Towel "Scrunch Ups" Use a hand towel or a moderate size towel Foot flat down on the towel Use toes to "scrunch up the towel" straight up and down, and going to the right and left.  3 sets of 20 * Can be done watching TV, reading, or sitting and relaxing.   Balance Exercises:  While brushing teeth, practice standing on 1 foot. Do for as long as you can, ideally 30 seconds to 1 minute each foot.

## 2015-08-31 DIAGNOSIS — Z0000 Encounter for general adult medical examination without abnormal findings: Secondary | ICD-10-CM | POA: Diagnosis not present

## 2015-10-02 ENCOUNTER — Telehealth: Payer: Self-pay | Admitting: Primary Care

## 2015-10-02 ENCOUNTER — Other Ambulatory Visit (INDEPENDENT_AMBULATORY_CARE_PROVIDER_SITE_OTHER): Payer: BLUE CROSS/BLUE SHIELD

## 2015-10-02 DIAGNOSIS — R945 Abnormal results of liver function studies: Secondary | ICD-10-CM

## 2015-10-02 DIAGNOSIS — R7989 Other specified abnormal findings of blood chemistry: Secondary | ICD-10-CM | POA: Diagnosis not present

## 2015-10-02 DIAGNOSIS — Z87898 Personal history of other specified conditions: Secondary | ICD-10-CM

## 2015-10-02 DIAGNOSIS — E785 Hyperlipidemia, unspecified: Secondary | ICD-10-CM | POA: Diagnosis not present

## 2015-10-02 LAB — HEPATIC FUNCTION PANEL
ALT: 40 U/L (ref 0–53)
AST: 19 U/L (ref 0–37)
Albumin: 4.2 g/dL (ref 3.5–5.2)
Alkaline Phosphatase: 58 U/L (ref 39–117)
Bilirubin, Direct: 0.1 mg/dL (ref 0.0–0.3)
TOTAL PROTEIN: 6.6 g/dL (ref 6.0–8.3)
Total Bilirubin: 0.8 mg/dL (ref 0.2–1.2)

## 2015-10-02 LAB — LIPID PANEL
CHOL/HDL RATIO: 5
CHOLESTEROL: 167 mg/dL (ref 0–200)
HDL: 32.3 mg/dL — ABNORMAL LOW (ref >39.00)
LDL CALC: 104 mg/dL — AB (ref 0–99)
NonHDL: 134.23
TRIGLYCERIDES: 152 mg/dL — AB (ref 0.0–149.0)
VLDL: 30.4 mg/dL (ref 0.0–40.0)

## 2015-10-02 MED ORDER — SCOPOLAMINE 1 MG/3DAYS TD PT72: MEDICATED_PATCH | TRANSDERMAL | 0 refills | Status: DC

## 2015-10-02 NOTE — Telephone Encounter (Signed)
Pt came in for labs today and would like to get motion sickness medication (the patch)  for an upcoming cruise.  Pharmacy of choice is [facility] [place].  Please call when completed. [phone]

## 2015-10-02 NOTE — Telephone Encounter (Signed)
Noted. Scopolamine patches sent into [facility] in [place]. Please notify patient.

## 2015-10-02 NOTE — Telephone Encounter (Signed)
Spoken and notified patient of Kate's comments. Patient verbalized understanding. 

## 2015-12-04 ENCOUNTER — Ambulatory Visit (INDEPENDENT_AMBULATORY_CARE_PROVIDER_SITE_OTHER): Payer: BLUE CROSS/BLUE SHIELD | Admitting: Primary Care

## 2015-12-04 VITALS — BP 140/86 | HR 86 | Temp 98.2°F | Ht 71.0 in | Wt 240.8 lb

## 2015-12-04 DIAGNOSIS — H9201 Otalgia, right ear: Secondary | ICD-10-CM

## 2015-12-04 DIAGNOSIS — Z23 Encounter for immunization: Secondary | ICD-10-CM

## 2015-12-04 NOTE — Patient Instructions (Signed)
Continue Aleve as needed for ear pain and inflammation.  Ear Pressure/Pain: Try using Flonase (fluticasone) nasal spray. Instill 1 spray in each nostril twice daily.   Check with your insurance company regarding the shingles vaccination.  It was a pleasure to see you today!

## 2015-12-04 NOTE — Addendum Note (Signed)
Addended by: Tawnya CrookSAMBATH, Ezel Vallone on: 12/04/2015 08:55 AM   Modules accepted: Orders

## 2015-12-04 NOTE — Progress Notes (Signed)
Pre visit review using our clinic review tool, if applicable. No additional management support is needed unless otherwise documented below in the visit note. 

## 2015-12-04 NOTE — Progress Notes (Signed)
Subjective:    Patient ID: Waynetta PeanRoger Rattigan, male    DOB: 1975-04-02, 40 y.o.   MRN: [id number]  HPI  Mr. Fredric MareBailey is a 40 year old male who presents today with a chief complaint of ear pain. His pain is located to the right ear which has been present for the past 4 days. He denies fevers, sore throat, sick contacts. He's taken Robitussin and Aleve with temporary improvement. Overall he's noticed slight improvement as he's been popping his ears.  Review of Systems  Constitutional: Negative for chills and fever.  HENT: Positive for ear pain and postnasal drip. Negative for congestion, sinus pressure and sore throat.   Respiratory: Negative for cough and shortness of breath.        Past Medical History:  Diagnosis Date  . Atrial fibrillation (HCC)    once, diagnosed on ECG  . Chickenpox   . Heart murmur   . IBS (irritable bowel syndrome)    Mixed     Social History   Social History  . Marital status: Married    Spouse name: N/A  . Number of children: N/A  . Years of education: N/A   Occupational History  . Not on file.   Social History Main Topics  . Smoking status: Former Games developermoker  . Smokeless tobacco: Never Used  . Alcohol use 0.0 oz/week     Comment: rarely  . Drug use: No  . Sexual activity: Not on file   Other Topics Concern  . Not on file   Social History Narrative   Married.   2 children.   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       Past Surgical History:  Procedure Laterality Date  . TONSILLECTOMY AND ADENOIDECTOMY  1985    Family History  Problem Relation Age of Onset  . Hyperlipidemia Mother   . Hypertension Mother   . Depression Mother   . Diabetes Mother   . Arthritis Father   . Hyperlipidemia Father   . Heart disease Father   . Stroke Father   . Depression Brother   . Hyperlipidemia Maternal Grandmother   . Hypertension Maternal Grandmother   . Depression Maternal Grandmother   . Hyperlipidemia Maternal Grandfather   . Stroke  Maternal Grandfather   . Hypertension Maternal Grandfather   . Hyperlipidemia Paternal Grandmother   . Stroke Paternal Grandmother   . Alcohol abuse Paternal Grandfather   . Arthritis Paternal Grandfather   . Hyperlipidemia Paternal Grandfather   . Hypertension Paternal Grandfather     No Known Allergies  Current Outpatient Prescriptions on File Prior to Visit  Medication Sig Dispense Refill  . diclofenac (VOLTAREN) 50 MG EC tablet Take 1 tablet (50 mg total) by mouth 2 (two) times daily as needed for moderate pain. 60 tablet 0  . fexofenadine (ALLEGRA) 180 MG tablet Take 180 mg by mouth daily.    . Fish Oil OIL Take 2,000 mg by mouth daily.    . Multiple Vitamin (MULTIVITAMIN) capsule Take 1 capsule by mouth daily.    . nitroGLYCERIN (NITRODUR - DOSED IN MG/24 HR) 0.2 mg/hr patch Apply 1/4 of a patch to the affected area and change every 24 hours (re: tendinopathy) 30 patch 4  . scopolamine (TRANSDERM-SCOP, 1.5 MG,) 1 MG/3DAYS Apply 1 patch behind the ear four hours prior to cruise. Replace patch every 3 days. 10 patch 0   No current facility-administered medications on file prior to visit.     BP 140/86  Pulse 86   Temp 98.2 F (36.8 C) (Oral)   Ht 5\' 11"  (1.803 m)   Wt 240 lb 12.8 oz (109.2 kg)   SpO2 99%   BMI 33.58 kg/m    Objective:   Physical Exam  Constitutional: He appears well-nourished.  HENT:  Right Ear: Ear canal normal. No tenderness. Tympanic membrane is not injected and not erythematous.  Left Ear: Tympanic membrane and ear canal normal.  Nose: No mucosal edema. Right sinus exhibits no maxillary sinus tenderness and no frontal sinus tenderness. Left sinus exhibits no maxillary sinus tenderness and no frontal sinus tenderness.  Mouth/Throat: Oropharynx is clear and moist.  Dullness to right TM  Eyes: Conjunctivae are normal.  Neck: Neck supple.  Cardiovascular: Normal rate and regular rhythm.   Pulmonary/Chest: Effort normal and breath sounds normal. He  has no wheezes. He has no rales.  Skin: Skin is warm and dry.          Assessment & Plan:  Ear Pain:  Located to right ear x 4 days.  No other symptoms. Overall improvement with OTC treatment. Exam today without evidence of acute infection. Mild effusion and dullness. Discussed use of Flonase, Aleve, Allegra. Follow up PRN.  Morrie Sheldonlark,Advay Volante Kendal, NP

## 2016-04-01 DIAGNOSIS — M791 Myalgia: Secondary | ICD-10-CM | POA: Diagnosis not present

## 2016-04-01 DIAGNOSIS — M542 Cervicalgia: Secondary | ICD-10-CM | POA: Diagnosis not present

## 2016-04-01 DIAGNOSIS — M545 Low back pain: Secondary | ICD-10-CM | POA: Diagnosis not present

## 2016-04-01 DIAGNOSIS — M50321 Other cervical disc degeneration at C4-C5 level: Secondary | ICD-10-CM | POA: Diagnosis not present

## 2016-04-01 DIAGNOSIS — M546 Pain in thoracic spine: Secondary | ICD-10-CM | POA: Diagnosis not present

## 2016-04-01 DIAGNOSIS — M50221 Other cervical disc displacement at C4-C5 level: Secondary | ICD-10-CM | POA: Diagnosis not present

## 2016-04-03 DIAGNOSIS — M546 Pain in thoracic spine: Secondary | ICD-10-CM | POA: Diagnosis not present

## 2016-04-03 DIAGNOSIS — M542 Cervicalgia: Secondary | ICD-10-CM | POA: Diagnosis not present

## 2016-04-03 DIAGNOSIS — M50221 Other cervical disc displacement at C4-C5 level: Secondary | ICD-10-CM | POA: Diagnosis not present

## 2016-04-03 DIAGNOSIS — M50321 Other cervical disc degeneration at C4-C5 level: Secondary | ICD-10-CM | POA: Diagnosis not present

## 2016-04-03 DIAGNOSIS — M791 Myalgia: Secondary | ICD-10-CM | POA: Diagnosis not present

## 2016-04-23 ENCOUNTER — Telehealth: Payer: Self-pay | Admitting: Primary Care

## 2016-04-23 NOTE — Telephone Encounter (Signed)
Noted, paperwork signed and placed in Chan's inbox.

## 2016-04-23 NOTE — Telephone Encounter (Signed)
Spoken to patient and notified form that was pick up. Left in the front office.

## 2016-04-23 NOTE — Telephone Encounter (Signed)
Pt dropped off forms to be filled out and signed for work ins. Labs showed high uric acid so he needs forms signed. I sch cpe for 5/3. He is requesting asap since work needs by 4/20 and he is out 4/19 & 4/20. I placed in Rx tower.

## 2016-05-09 ENCOUNTER — Ambulatory Visit (INDEPENDENT_AMBULATORY_CARE_PROVIDER_SITE_OTHER): Payer: BLUE CROSS/BLUE SHIELD | Admitting: Primary Care

## 2016-05-09 ENCOUNTER — Encounter: Payer: Self-pay | Admitting: Primary Care

## 2016-05-09 VITALS — BP 116/74 | HR 88 | Temp 98.0°F | Ht 71.0 in | Wt 243.8 lb

## 2016-05-09 DIAGNOSIS — J302 Other seasonal allergic rhinitis: Secondary | ICD-10-CM

## 2016-05-09 DIAGNOSIS — E785 Hyperlipidemia, unspecified: Secondary | ICD-10-CM | POA: Diagnosis not present

## 2016-05-09 DIAGNOSIS — Z0000 Encounter for general adult medical examination without abnormal findings: Secondary | ICD-10-CM

## 2016-05-09 DIAGNOSIS — J309 Allergic rhinitis, unspecified: Secondary | ICD-10-CM | POA: Insufficient documentation

## 2016-05-09 LAB — LIPID PANEL
Cholesterol: 168 mg/dL (ref 0–200)
HDL: 38.2 mg/dL — AB (ref >39.00)
LDL Cholesterol: 99 mg/dL (ref 0–99)
NonHDL: 130.12
Total CHOL/HDL Ratio: 4
Triglycerides: 155 mg/dL — ABNORMAL HIGH (ref 0.0–149.0)
VLDL: 31 mg/dL (ref 0.0–40.0)

## 2016-05-09 NOTE — Assessment & Plan Note (Signed)
Immunizations UTD. Commended him on improvements in diet, discussed to increase consumption of vegetables. Continue exercise. Exam unremarkable. Labs pending. Follow up in 1 year for annual exam.

## 2016-05-09 NOTE — Assessment & Plan Note (Signed)
Due for repeat lipids today. Commended him on improvements in diet, discussed to increase vegetables, fruit.

## 2016-05-09 NOTE — Progress Notes (Signed)
Pre visit review using our clinic review tool, if applicable. No additional management support is needed unless otherwise documented below in the visit note. 

## 2016-05-09 NOTE — Assessment & Plan Note (Signed)
Stable on Allegra and Flonase.

## 2016-05-09 NOTE — Patient Instructions (Signed)
Complete lab work prior to leaving today. I will notify you of your results once received.   Continue exercising. You should be getting 150 minutes of moderate intensity exercise weekly.  Continue to work on [employer] diet.  Ensure you are consuming 64 ounces of water daily.  Follow up in 1 year for your annual exam or sooner if needed.  It was a pleasure to see you today!

## 2016-05-09 NOTE — Progress Notes (Signed)
Subjective:    Patient ID: Stephen Sweeney, male    DOB: Jul 06, 1975, 41 y.o.   MRN: [id number]  HPI  [name] is a 41 year old male who presents today for complete physical.  Immunizations: -Tetanus: Completed in 2017 -Influenza: Completed last seasn   Diet: He started eating a healthier diet.  Breakfast: Chicken biscuit Lunch: Hamburger, hot dog, grilled chicken - no bread; veggies Dinner: Grilled/baked chicken, baked pork chops, veggies Snacks: None Desserts: 2-3 times weekly Beverages: Coffee, water, soda  Exercise: He does his elliptical 30-45 minutes 3-4 times weekly Eye exam: Completed in 2017 Dental exam: Completes every 3 months.    Review of Systems  Constitutional: Negative for unexpected weight change.  HENT: Negative for rhinorrhea.   Respiratory: Negative for cough and shortness of breath.   Cardiovascular: Negative for chest pain.  Gastrointestinal: Negative for constipation and diarrhea.  Genitourinary: Negative for difficulty urinating.  Musculoskeletal: Negative for arthralgias and myalgias.  Skin: Negative for rash.  Allergic/Immunologic: Positive for environmental allergies.  Neurological: Negative for dizziness, numbness and headaches.  Psychiatric/Behavioral:       He denies concerns for anxiety or depression       Past Medical History:  Diagnosis Date  . Atrial fibrillation (HCC)    once, diagnosed on ECG  . Chickenpox   . Heart murmur   . IBS (irritable bowel syndrome)    Mixed     Social History   Social History  . Marital status: Married    Spouse name: N/A  . Number of children: N/A  . Years of education: N/A   Occupational History  . Not on file.   Social History Main Topics  . Smoking status: Former Games developermoker  . Smokeless tobacco: Never Used  . Alcohol use 0.0 oz/week     Comment: rarely  . Drug use: No  . Sexual activity: Not on file   Other Topics Concern  . Not on file   Social History Narrative   Married.   2  children.   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       Past Surgical History:  Procedure Laterality Date  . TONSILLECTOMY AND ADENOIDECTOMY  1985    Family History  Problem Relation Age of Onset  . Hyperlipidemia Mother   . Hypertension Mother   . Depression Mother   . Diabetes Mother   . Arthritis Father   . Hyperlipidemia Father   . Heart disease Father   . Stroke Father   . Depression Brother   . Hyperlipidemia Maternal Grandmother   . Hypertension Maternal Grandmother   . Depression Maternal Grandmother   . Hyperlipidemia Maternal Grandfather   . Stroke Maternal Grandfather   . Hypertension Maternal Grandfather   . Hyperlipidemia Paternal Grandmother   . Stroke Paternal Grandmother   . Alcohol abuse Paternal Grandfather   . Arthritis Paternal Grandfather   . Hyperlipidemia Paternal Grandfather   . Hypertension Paternal Grandfather     No Known Allergies  Current Outpatient Prescriptions on File Prior to Visit  Medication Sig Dispense Refill  . diclofenac (VOLTAREN) 50 MG EC tablet Take 1 tablet (50 mg total) by mouth 2 (two) times daily as needed for moderate pain. 60 tablet 0  . fexofenadine (ALLEGRA) 180 MG tablet Take 180 mg by mouth daily.    . Fish Oil OIL Take 2,000 mg by mouth daily.    . Multiple Vitamin (MULTIVITAMIN) capsule Take 1 capsule by mouth daily.  No current facility-administered medications on file prior to visit.     BP 116/74   Pulse 88   Temp 98 F (36.7 C) (Oral)   Ht 5\' 11"  (1.803 m)   Wt 243 lb 12.8 oz (110.6 kg)   SpO2 98%   BMI 34.00 kg/m    Objective:   Physical Exam  Constitutional: He is oriented to person, place, and time. He appears well-nourished.  HENT:  Right Ear: Tympanic membrane and ear canal normal.  Left Ear: Tympanic membrane and ear canal normal.  Nose: Nose normal. Right sinus exhibits no maxillary sinus tenderness and no frontal sinus tenderness. Left sinus exhibits no maxillary sinus tenderness  and no frontal sinus tenderness.  Mouth/Throat: Oropharynx is clear and moist.  Eyes: Conjunctivae and EOM are normal. Pupils are equal, round, and reactive to light.  Neck: Neck supple. Carotid bruit is not present. No thyromegaly present.  Cardiovascular: Normal rate, regular rhythm and normal heart sounds.   Pulmonary/Chest: Effort normal and breath sounds normal. He has no wheezes. He has no rales.  Abdominal: Soft. Bowel sounds are normal. There is no tenderness.  Musculoskeletal: Normal range of motion.  Neurological: He is alert and oriented to person, place, and time. He has normal reflexes. No cranial nerve deficit.  Skin: Skin is warm and dry.  Psychiatric: He has a normal mood and affect.          Assessment & Plan:

## 2016-09-27 DIAGNOSIS — H538 Other visual disturbances: Secondary | ICD-10-CM | POA: Diagnosis not present

## 2016-12-06 ENCOUNTER — Ambulatory Visit: Payer: BLUE CROSS/BLUE SHIELD | Admitting: Primary Care

## 2016-12-06 VITALS — BP 120/78 | HR 78 | Temp 98.2°F | Ht 71.0 in | Wt 247.8 lb

## 2016-12-06 DIAGNOSIS — R591 Generalized enlarged lymph nodes: Secondary | ICD-10-CM | POA: Diagnosis not present

## 2016-12-06 NOTE — Progress Notes (Signed)
Subjective:    Patient ID: Stephen Sweeney, male    DOB: 12/03/1975, 41 y.o.   MRN: [id number]  HPI  Stephen Sweeney is a 41 year old male who presents today with a chief complaint of swollen lymph node. He first noticed this 2 days ago. His main concern is the soreness. He first noticed this 2 days ago. He took one Ibuprofen tablet and noticed improvement when he woke yesterday morning. As the day progressed he noticed increased soreness. He then took another Ibuprofen last night, woke up this morning still sore. He notices the soreness more so when he turns his head to the right.   He denies fevers, cough, sore throat, sick contacts, palpitations, dental pain.  He has noticed rhinorrhea but has seasonal allergies. He is under a lot of stress with work.   Review of Systems  Constitutional: Negative for fatigue.  HENT: Negative for congestion, ear pain, sinus pressure and sore throat.   Respiratory: Negative for cough.   Hematological: Positive for adenopathy.       Past Medical History:  Diagnosis Date  . Atrial fibrillation (HCC)    once, diagnosed on ECG  . Chickenpox   . Heart murmur   . IBS (irritable bowel syndrome)    Mixed     Social History   Socioeconomic History  . Marital status: Married    Spouse name: Not on file  . Number of children: Not on file  . Years of education: Not on file  . Highest education level: Not on file  Social Needs  . Financial resource strain: Not on file  . Food insecurity - worry: Not on file  . Food insecurity - inability: Not on file  . Transportation needs - medical: Not on file  . Transportation needs - non-medical: Not on file  Occupational History  . Not on file  Tobacco Use  . Smoking status: Former Games developermoker  . Smokeless tobacco: Never Used  Substance and Sexual Activity  . Alcohol use: Yes    Alcohol/week: 0.0 oz    Comment: rarely  . Drug use: No  . Sexual activity: Not on file  Other Topics Concern  . Not on file  Social  History Narrative   Married.   2 children.   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      Family History  Problem Relation Age of Onset  . Hyperlipidemia Mother   . Hypertension Mother   . Depression Mother   . Diabetes Mother   . Arthritis Father   . Hyperlipidemia Father   . Heart disease Father   . Stroke Father   . Depression Brother   . Hyperlipidemia Maternal Grandmother   . Hypertension Maternal Grandmother   . Depression Maternal Grandmother   . Hyperlipidemia Maternal Grandfather   . Stroke Maternal Grandfather   . Hypertension Maternal Grandfather   . Hyperlipidemia Paternal Grandmother   . Stroke Paternal Grandmother   . Alcohol abuse Paternal Grandfather   . Arthritis Paternal Grandfather   . Hyperlipidemia Paternal Grandfather   . Hypertension Paternal Grandfather     No Known Allergies  Current Outpatient Medications on File Prior to Visit  Medication Sig Dispense Refill  . fexofenadine (ALLEGRA) 180 MG tablet Take 180 mg by mouth daily.    . Fish Oil OIL Take 2,000 mg by mouth daily.    . Multiple Vitamin (MULTIVITAMIN) capsule Take 1 capsule by mouth daily.     No current facility-administered  medications on file prior to visit.     BP 120/78   Pulse 78   Temp 98.2 F (36.8 C) (Oral)   Ht 5\' 11"  (1.803 m)   Wt 247 lb 12.8 oz (112.4 kg)   SpO2 98%   BMI 34.56 kg/m    Objective:   Physical Exam  Constitutional: He appears well-nourished.  HENT:  Right Ear: Tympanic membrane and ear canal normal.  Left Ear: Tympanic membrane and ear canal normal.  Nose: No mucosal edema. Right sinus exhibits no maxillary sinus tenderness and no frontal sinus tenderness. Left sinus exhibits no maxillary sinus tenderness and no frontal sinus tenderness.  Mouth/Throat: Oropharynx is clear and moist.  Stenson's duct unremarkable.   Eyes: Conjunctivae are normal.  Neck:    Mild swelling noted upon palpation to left submandibular lymph node. Mild  tenderness.   Cardiovascular: Normal rate and regular rhythm.  Pulmonary/Chest: Effort normal and breath sounds normal. He has no wheezes. He has no rales.  Lymphadenopathy:    He has cervical adenopathy.  Skin: Skin is warm and dry.          Assessment & Plan:  Lymphadenopathy:  Present to left cervical chain x 2 days. Tender. Exam today without evidence of viral or bacterial cause. Unremarkable exam. Consider allergy or stress cause. Will have him continue Allegra, start Flonase, start Ibuprofen TID PRN. Discussed return precautions.  Morrie Sheldonlark,Areen Trautner Kendal, NP

## 2016-12-06 NOTE — Patient Instructions (Signed)
Continue Allegra daily.  Start using Flonase (fluticasone) nasal spray. Instill 1 spray in each nostril twice daily.   Start Ibuprofen 600 mg every 8 hours for swelling and pain. Do this for at least 4-5 days.   Please call me if you develop fevers, increased swelling, cough, no improvement in 4-5 days.  It was a pleasure to see you today!

## 2016-12-29 IMAGING — DX DG ANKLE COMPLETE 3+V*L*
3 series · 4 of 4 positions shown · non-contrast
Comparison: None.

CLINICAL DATA: Left ankle pain for 1 month after injury

EXAM:
LEFT ANKLE COMPLETE - 3+ VIEW

[ankle ap]
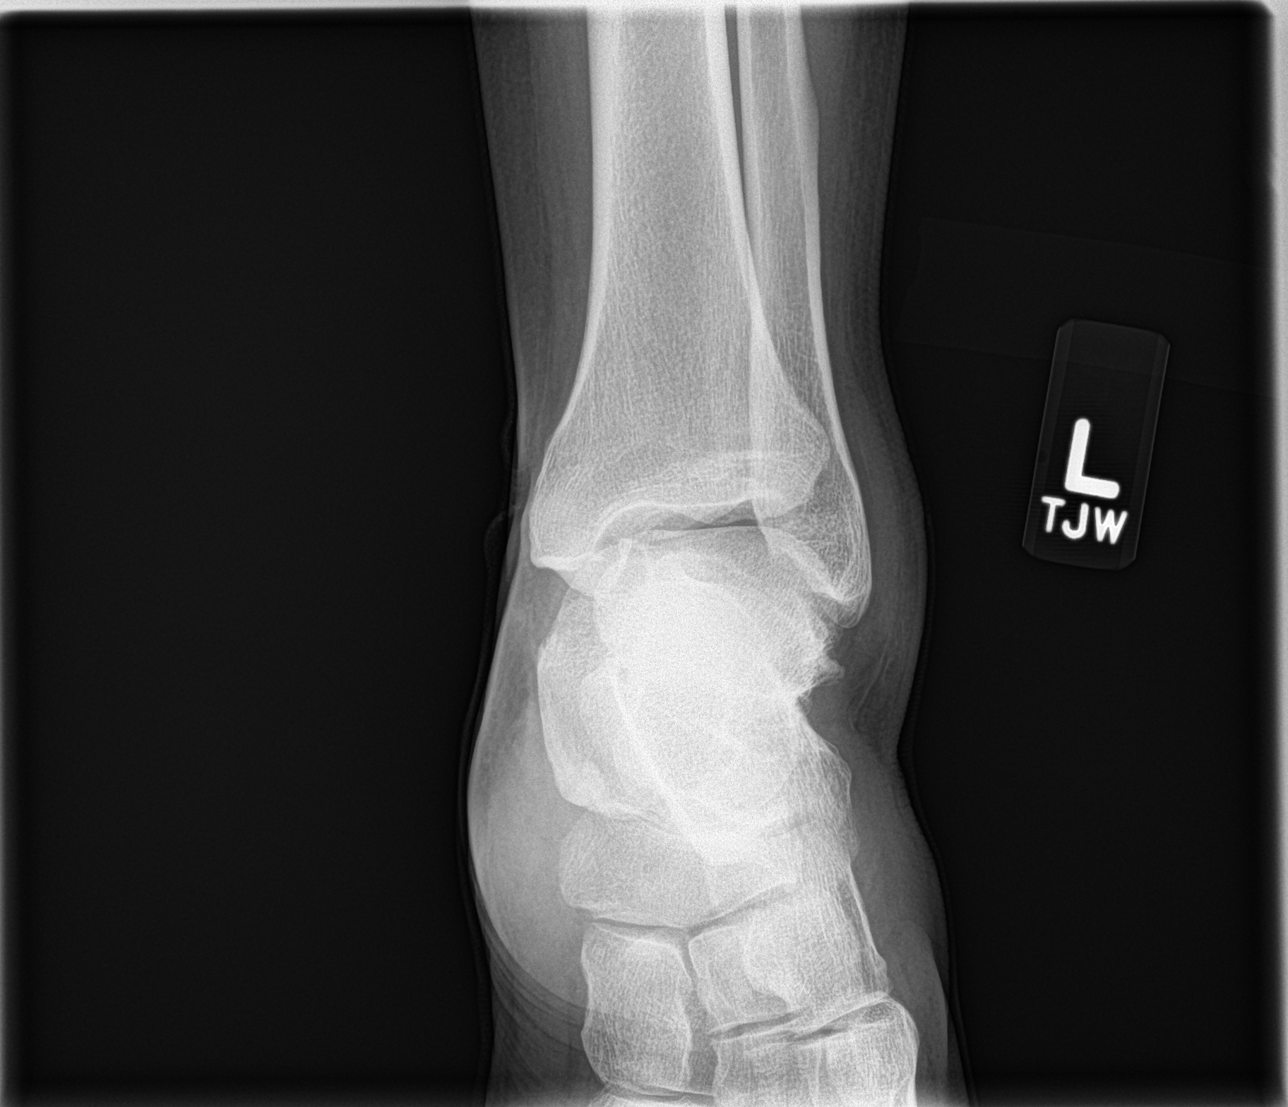

[Series 2: ankle obl · 0.14mm/px · 2 of 2 slices shown]
[im 1/2]
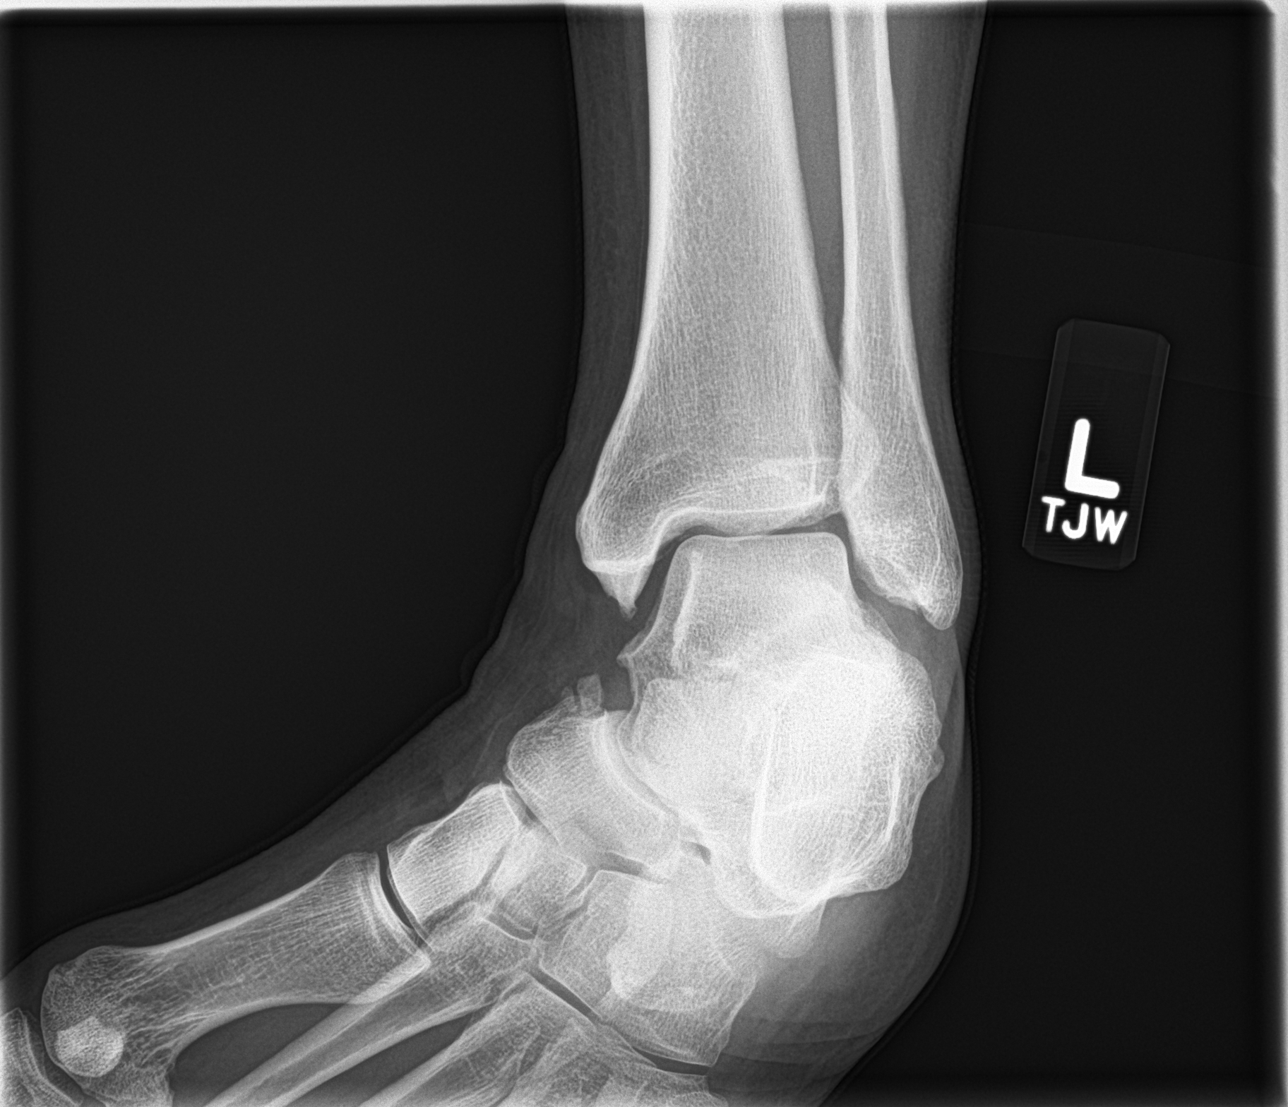
[im 2/2]
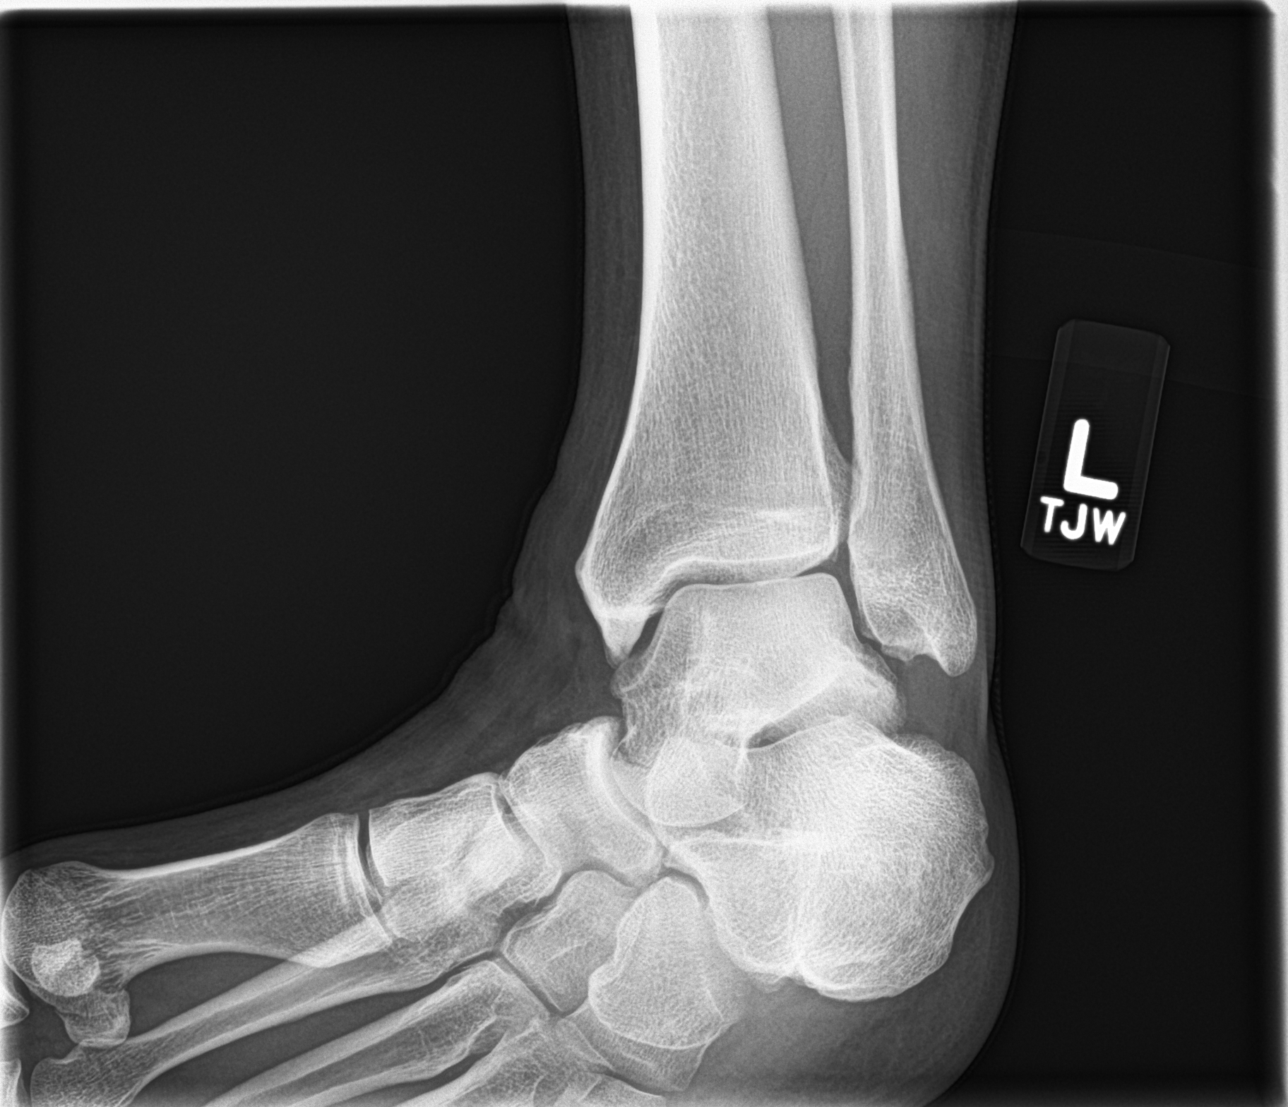

[ankle lat]
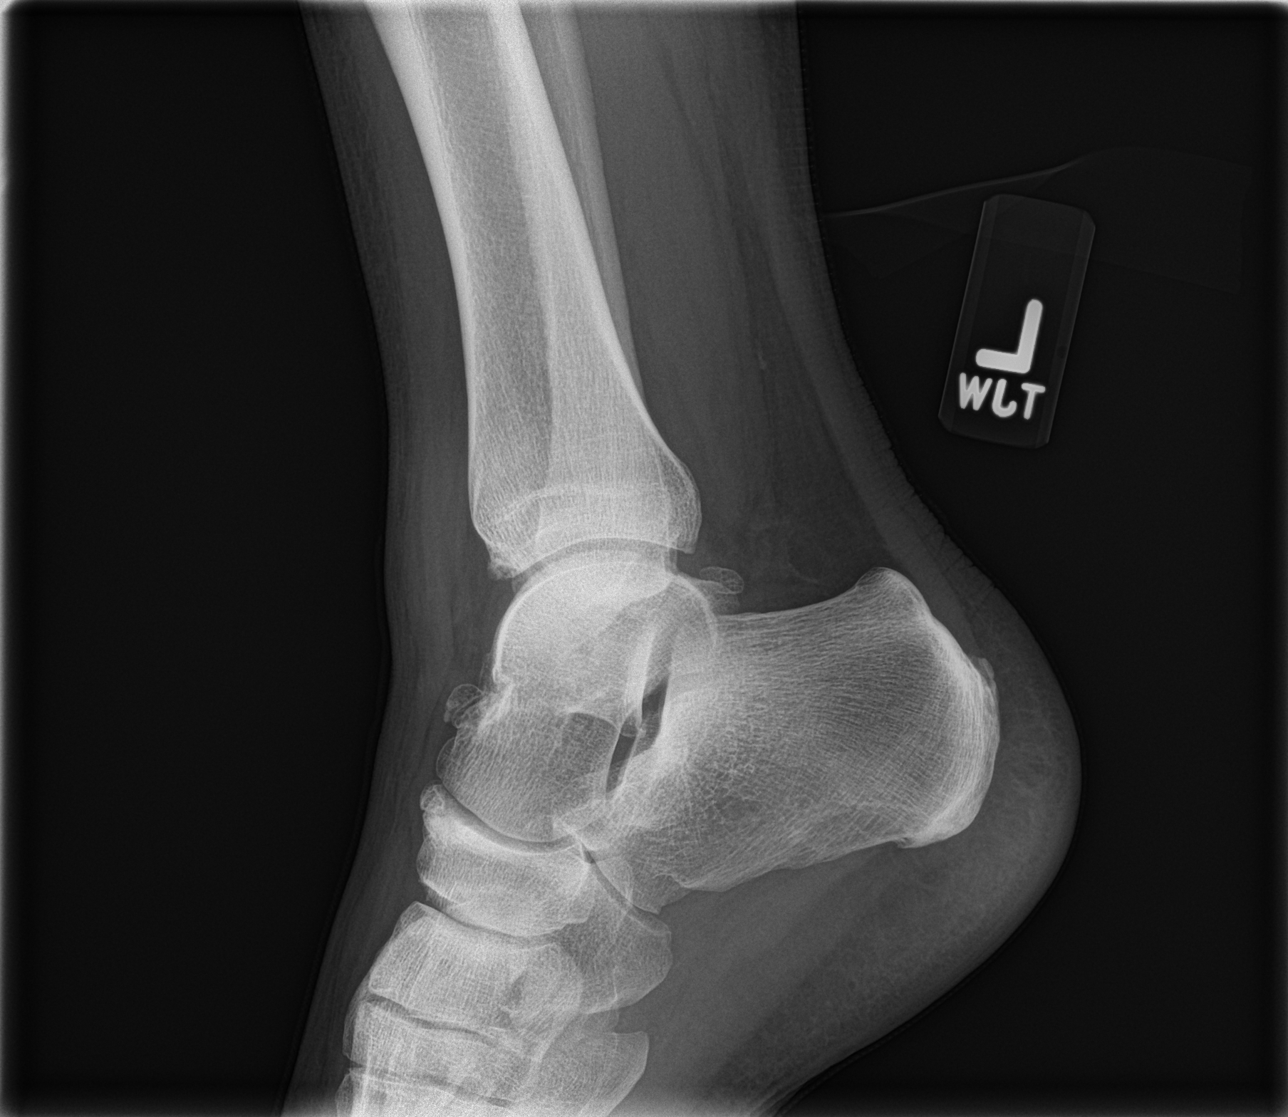

[4 of 4 positions shown; findings below may reference images not displayed]

FINDINGS: No acute fracture is seen. The ankle joint appears normal. Alignment
is normal. There is degenerative change in the mid and hindfoot
present.
IMPRESSION: Degenerative change in the mid and hindfoot.  No acute abnormality.

## 2017-11-07 ENCOUNTER — Ambulatory Visit (INDEPENDENT_AMBULATORY_CARE_PROVIDER_SITE_OTHER)
Admission: RE | Admit: 2017-11-07 | Discharge: 2017-11-07 | Disposition: A | Discharge status: Home / Self Care | Payer: BLUE CROSS/BLUE SHIELD | Source: Ambulatory Visit | Attending: Primary Care | Admitting: Primary Care

## 2017-11-07 ENCOUNTER — Encounter: Payer: Self-pay | Admitting: Primary Care

## 2017-11-07 ENCOUNTER — Ambulatory Visit: Payer: BLUE CROSS/BLUE SHIELD | Admitting: Primary Care

## 2017-11-07 VITALS — BP 116/74 | HR 70 | Temp 98.1°F | Ht 71.0 in | Wt 256.5 lb

## 2017-11-07 DIAGNOSIS — G8929 Other chronic pain: Secondary | ICD-10-CM

## 2017-11-07 DIAGNOSIS — M25571 Pain in right ankle and joints of right foot: Secondary | ICD-10-CM

## 2017-11-07 DIAGNOSIS — M7989 Other specified soft tissue disorders: Secondary | ICD-10-CM | POA: Diagnosis not present

## 2017-11-07 DIAGNOSIS — Z23 Encounter for immunization: Secondary | ICD-10-CM | POA: Diagnosis not present

## 2017-11-07 LAB — URIC ACID: Uric Acid, Serum: 6.8 mg/dL (ref 4.0–7.8)

## 2017-11-07 NOTE — Assessment & Plan Note (Signed)
Acute on chronic flare x 1-2 months.  Likely MSK in cause, potentially arthritis. Check uric acid to rule out chronic gout. Plain films of right ankle pending.   Discussed to stop "popping" his ankle purposefully, wear brace for support, ice/eleveate in the evening. Consider sports medicine evaluation if needed.

## 2017-11-07 NOTE — Progress Notes (Signed)
Subjective:    Patient ID: Stephen Sweeney, male    DOB: [birth year], 42 y.o.   MRN: [id number]  HPI  Stephen Sweeney is a 42 year old male who presents today with a chief complaint of ankle pain.  His pain is located to the right lateral ankle that began since high school years after "turning it". He was it crutches and an air cast for 6 weeks, no torn ligaments at that time. Over the last 1-2 months he's noticed an increased amount of pain and swelling. He's found that if he "pops" the ankle the swelling will reduce. He notices his pain when walking upstairs, standing for prolonged periods of time.   He denies recent re-injury/trauma, skin color change, numbness/tingling. He's been icing and taking Aleve intermittently without much improvement.   Review of Systems  Musculoskeletal: Positive for arthralgias.       Right ankle swelling and pain  Skin: Negative for color change.       Past Medical History:  Diagnosis Date  . Atrial fibrillation (HCC)    once, diagnosed on ECG  . Chickenpox   . Heart murmur   . IBS (irritable bowel syndrome)    Mixed     Social History   Socioeconomic History  . Marital status: Married    Spouse name: Not on file  . Number of children: Not on file  . Years of education: Not on file  . Highest education level: Not on file  Occupational History  . Not on file  Social Needs  . Financial resource strain: Not on file  . Food insecurity:    Worry: Not on file    Inability: Not on file  . Transportation needs:    Medical: Not on file    Non-medical: Not on file  Tobacco Use  . Smoking status: Former Games developer  . Smokeless tobacco: Never Used  Substance and Sexual Activity  . Alcohol use: Yes    Alcohol/week: 0.0 standard drinks    Comment: rarely  . Drug use: No  . Sexual activity: Not on file  Lifestyle  . Physical activity:    Days per week: Not on file    Minutes per session: Not on file  . Stress: Not on file  Relationships  . Social  connections:    Talks on phone: Not on file    Gets together: Not on file    Attends religious service: Not on file    Active member of club or organization: Not on file    Attends meetings of clubs or organizations: Not on file    Relationship status: Not on file  . Intimate partner violence:    Fear of current or ex partner: Not on file    Emotionally abused: Not on file    Physically abused: Not on file    Forced sexual activity: Not on file  Other Topics Concern  . Not on file  Social History Narrative   Married.   2 children.   Works in Consulting civil engineer.   Enjoys playing video games.      Family History  Problem Relation Age of Onset  . Hyperlipidemia Mother   . Hypertension Mother   . Depression Mother   . Diabetes Mother   . Arthritis Father   . Hyperlipidemia Father   . Heart disease Father   . Stroke Father   . Depression Brother   . Hyperlipidemia Maternal Grandmother   . Hypertension Maternal Grandmother   . Depression  Maternal Grandmother   . Hyperlipidemia Maternal Grandfather   . Stroke Maternal Grandfather   . Hypertension Maternal Grandfather   . Hyperlipidemia Paternal Grandmother   . Stroke Paternal Grandmother   . Alcohol abuse Paternal Grandfather   . Arthritis Paternal Grandfather   . Hyperlipidemia Paternal Grandfather   . Hypertension Paternal Grandfather     No Known Allergies  Current Outpatient Medications on File Prior to Visit  Medication Sig Dispense Refill  . Cats Claw 500 MG CAPS     . fexofenadine (ALLEGRA) 180 MG tablet Take 180 mg by mouth daily.    . TURMERIC PO Take 1,400 mg by mouth daily.     No current facility-administered medications on file prior to visit.     BP 116/74   Pulse 70   Temp 98.1 F (36.7 C) (Oral)   Ht 5\' 11"  (1.803 m)   Wt 256 lb 8 oz (116.3 kg)   SpO2 97%   BMI 35.77 kg/m    Objective:   Physical Exam  Constitutional: He appears well-nourished.  Musculoskeletal:       Right ankle: He exhibits  swelling. He exhibits normal range of motion, no deformity and normal pulse. No tenderness. Achilles tendon exhibits no pain.       Feet:  Moderate swelling to right lateral malleolus, some to medial malleolus    Skin: Skin is warm and dry. No erythema.           Assessment & Plan:

## 2017-11-07 NOTE — Patient Instructions (Signed)
Complete xray(s) prior to leaving today. I will notify you of your results once received.  Use a support brace/sleeve to the ankle during acute flares. Take this off at night or when at home resting.   Continue Aleve as needed for inflammation/pain.   Elevate/ice your ankle when at home resting.  It was a pleasure to see you today!

## 2017-11-07 NOTE — Addendum Note (Signed)
Addended by: Tawnya Crook on: 11/07/2017 08:55 AM   Modules accepted: Orders

## 2017-12-15 ENCOUNTER — Encounter: Payer: Self-pay | Admitting: Primary Care

## 2017-12-15 ENCOUNTER — Ambulatory Visit: Payer: BLUE CROSS/BLUE SHIELD | Admitting: Primary Care

## 2017-12-15 VITALS — BP 124/82 | HR 71 | Temp 98.0°F | Ht 71.0 in | Wt 256.8 lb

## 2017-12-15 DIAGNOSIS — R400 Somnolence: Secondary | ICD-10-CM

## 2017-12-15 DIAGNOSIS — G4730 Sleep apnea, unspecified: Secondary | ICD-10-CM | POA: Insufficient documentation

## 2017-12-15 LAB — COMPREHENSIVE METABOLIC PANEL
ALBUMIN: 4.8 g/dL (ref 3.5–5.2)
ALK PHOS: 56 U/L (ref 39–117)
ALT: 25 U/L (ref 0–53)
AST: 18 U/L (ref 0–37)
BILIRUBIN TOTAL: 0.6 mg/dL (ref 0.2–1.2)
BUN: 16 mg/dL (ref 6–23)
CALCIUM: 9.7 mg/dL (ref 8.4–10.5)
CO2: 25 mEq/L (ref 19–32)
Chloride: 105 mEq/L (ref 96–112)
Creatinine, Ser: 1.14 mg/dL (ref 0.40–1.50)
GFR: 74.21 mL/min (ref >60.00)
Glucose, Bld: 109 mg/dL — ABNORMAL HIGH (ref 70–99)
Potassium: 4.3 mEq/L (ref 3.5–5.1)
Sodium: 140 mEq/L (ref 135–145)
TOTAL PROTEIN: 7.4 g/dL (ref 6.0–8.3)

## 2017-12-15 LAB — CBC
HCT: 44.3 % (ref 39.0–52.0)
HEMOGLOBIN: 15.7 g/dL (ref 13.0–17.0)
MCHC: 35.5 g/dL (ref 30.0–36.0)
MCV: 88.8 fl (ref 78.0–100.0)
PLATELETS: 260 10*3/uL (ref 150.0–400.0)
RBC: 4.98 Mil/uL (ref 4.22–5.81)
RDW: 13.2 % (ref 11.5–15.5)
WBC: 9.2 10*3/uL (ref 4.0–10.5)

## 2017-12-15 LAB — TSH: TSH: 1.98 u[IU]/mL (ref 0.35–4.50)

## 2017-12-15 NOTE — Patient Instructions (Signed)
Stop by the front desk and speak with either Shirlee LimerickMarion or Alvina ChouAnastasiya regarding your referral to pulmonology.  Stop by the lab prior to leaving today. I will notify you of your results once received.   It was a pleasure to see you today!

## 2017-12-15 NOTE — Assessment & Plan Note (Signed)
Epworth Sleepiness Score of 18 today. Symptoms characteristic of sleep disorder, likely sleep apnea. Check labs to rule out metabolic cause. Referral placed to pulmonology for evaluation and sleep study.

## 2017-12-15 NOTE — Progress Notes (Signed)
Subjective:    Patient ID: Stephen Sweeney, male    DOB: June 21, 1975, 42 y.o.   MRN: [id number]  HPI  Stephen Sweeney is a 42 year old male who presents today with a chief complaint of daytime drowsiness.  Over the past 1-2 years he's noticed feeling drowsy without feeling tired. He feels like if he closes his eyes he could take a nap, will do this sometimes when looking at his computer, resting, driving, sitting in a meeting. He doesn't feel fatigued, doesn't feel the need to want to sleep.  Monday, Wednesday, Friday he will wake up at 5 am, stays up until about 11pm-12 am. Tuesday's and Thursday's he will wake at 5 am and go to bed around 9 pm. When going to bed at 9 pm, he will wake up around 1-2 am and will be ready to start his day. He will toss and turn throughout the night until he wakes. He will take 1-2 hour naps during Saturday and Sunday, does wake between 1-2 am on those days, will toss and turn throughout the night.   He does snore, witnessed by his wife. He's also noticed that he doesn't wake up with an erection, this was not the case several years ago. He will sometimes quickly lose an erection during intercourse.   Review of Systems  Respiratory: Negative for shortness of breath.        Snoring, daytime drowsiness.   Cardiovascular: Negative for chest pain.  Neurological: Negative for dizziness and headaches.  Psychiatric/Behavioral: The patient is not nervous/anxious.        Past Medical History:  Diagnosis Date  . Atrial fibrillation (HCC)    once, diagnosed on ECG  . Chickenpox   . Chronic ankle pain   . Heart murmur   . Hyperlipidemia   . IBS (irritable bowel syndrome)    Mixed     Social History   Socioeconomic History  . Marital status: Married    Spouse name: Not on file  . Number of children: Not on file  . Years of education: Not on file  . Highest education level: Not on file  Occupational History  . Not on file  Social Needs  . Financial resource  strain: Not on file  . Food insecurity:    Worry: Not on file    Inability: Not on file  . Transportation needs:    Medical: Not on file    Non-medical: Not on file  Tobacco Use  . Smoking status: Former Games developer  . Smokeless tobacco: Never Used  Substance and Sexual Activity  . Alcohol use: Yes    Alcohol/week: 0.0 standard drinks    Comment: rarely  . Drug use: No  . Sexual activity: Not on file  Lifestyle  . Physical activity:    Days per week: Not on file    Minutes per session: Not on file  . Stress: Not on file  Relationships  . Social connections:    Talks on phone: Not on file    Gets together: Not on file    Attends religious service: Not on file    Active member of club or organization: Not on file    Attends meetings of clubs or organizations: Not on file    Relationship status: Not on file  . Intimate partner violence:    Fear of current or ex partner: Not on file    Emotionally abused: Not on file    Physically abused: Not on file  Forced sexual activity: Not on file  Other Topics Concern  . Not on file  Social History Narrative   Married.   2 children.   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    Past Surgical History:  Procedure Laterality Date  . TONSILLECTOMY AND ADENOIDECTOMY  1985    Family History  Problem Relation Age of Onset  . Hyperlipidemia Mother   . Hypertension Mother   . Depression Mother   . Diabetes Mother   . Arthritis Father   . Hyperlipidemia Father   . Heart disease Father   . Stroke Father   . Depression Brother   . Hyperlipidemia Maternal Grandmother   . Hypertension Maternal Grandmother   . Depression Maternal Grandmother   . Hyperlipidemia Maternal Grandfather   . Stroke Maternal Grandfather   . Hypertension Maternal Grandfather   . Hyperlipidemia Paternal Grandmother   . Stroke Paternal Grandmother   . Alcohol abuse Paternal Grandfather   . Arthritis Paternal Grandfather   . Hyperlipidemia Paternal  Grandfather   . Hypertension Paternal Grandfather     No Known Allergies  Current Outpatient Medications on File Prior to Visit  Medication Sig Dispense Refill  . Cats Claw 500 MG CAPS     . fexofenadine (ALLEGRA) 180 MG tablet Take 180 mg by mouth daily.    . TURMERIC PO Take 1,400 mg by mouth daily.     No current facility-administered medications on file prior to visit.     BP 124/82   Pulse 71   Temp 98 F (36.7 C) (Oral)   Ht 5\' 11"  (1.803 m)   Wt 256 lb 12 oz (116.5 kg)   SpO2 98%   BMI 35.81 kg/m    Objective:   Physical Exam  Constitutional: He appears well-nourished.  Neck: Neck supple.  Cardiovascular: Normal rate and regular rhythm.  Respiratory: Effort normal and breath sounds normal.  Skin: Skin is warm and dry.  Psychiatric: He has a normal mood and affect.           Assessment & Plan:

## 2017-12-16 ENCOUNTER — Encounter: Payer: Self-pay | Admitting: Internal Medicine

## 2017-12-16 ENCOUNTER — Ambulatory Visit (INDEPENDENT_AMBULATORY_CARE_PROVIDER_SITE_OTHER): Payer: BLUE CROSS/BLUE SHIELD | Admitting: Internal Medicine

## 2017-12-16 VITALS — BP 136/100 | HR 78 | Resp 16 | Ht 71.0 in | Wt 256.0 lb

## 2017-12-16 DIAGNOSIS — G4719 Other hypersomnia: Secondary | ICD-10-CM

## 2017-12-16 NOTE — Patient Instructions (Addendum)
Will send for sleep study. Call us 1 week after sleep study for results.    Sleep Apnea    Sleep apnea is disorder that affects a person's sleep. A person with sleep apnea has abnormal pauses in their breathing when they sleep. It is hard for them to get a good sleep. This makes a person tired during the day. It also can lead to other physical problems. There are three types of sleep apnea. One type is when breathing stops for a short time because your airway is blocked (obstructive sleep apnea). Another type is when the brain sometimes fails to give the normal signal to breathe to the muscles that control your breathing (central sleep apnea). The third type is a combination of the other two types.  HOME CARE   Take all medicine as told by your doctor.  Avoid alcohol, calming medicines (sedatives), and depressant drugs.  Try to lose weight if you are overweight. Talk to your doctor about a healthy weight goal.  Your doctor may have you use a device that helps to open your airway. It can help you get the air that you need. It is called a positive airway pressure (PAP) device.   MAKE SURE YOU:   Understand these instructions.  Will watch your condition.  Do not drive while drowsy.   Will get help right away if you are not doing well or get worse.  It may take approximately 1 month for you to get used to wearing her CPAP every night.  Be sure to work with your machine to get used to it, be patient, it may take time!  If you have trouble tolerating CPAP DO NOT RETURN YOUR MACHINE; Contact our office to see if we can help you tolerate the CPAP better first!

## 2017-12-16 NOTE — Progress Notes (Signed)
[facility]      Assessment and Plan:  Excessive daytime sleepiness. -Symptoms and signs of obstructive sleep apnea. - Patient also has a highly varied sleep schedule which may be contributing to daytime sleepiness, discussed that reducing the variability in his sleep time may help with his daytime sleepiness. - Patient will be sent for a sleep study, he does not think he would want to start CPAP, therefore if positive would consider sending for a dental device. - Discussed the importance of avoiding drowsy driving.  Restless leg syndrome. - May be related to underlying obstructive sleep apnea, will consider further work-up based on results of upcoming sleep study.  History of atrial fibrillation. - Not a current diagnosis, patient had a previous episode of atrial fibrillation when he was smoking and drinking a lot of caffeine.  Orders Placed This Encounter  Procedures  . Home sleep test   Return as needed depending on results of sleep study. .   Date: 12/16/2017  MRN# [id number] Stephen Sweeney 01-10-1975   Stephen Sweeney is a 42 y.o. old male seen in consultation for chief complaint of:    Chief Complaint  Patient presents with  . Consult    Referred by Vernona RiegerKatherine Clark for eval of OSA.  Marland Kitchen. drowsiness    for several years.    HPI:   Patient presents today with symptoms of excessive daytime sleepiness/drowsiness.  Patient goes to bed at variable times between 9 PM and midnight.  He wakes up at variable times between 5 AM and 10 AM.  Epworth score is elevated at 18 today. He plays video games as a hobby, so about 5 days per week he plays until midnight and sometimes until 2-3 am. He usually gets 6 hours per night of sleep. On tues and thurs he goes to bed at 9 pm, but on those days he has difficulty falling asleep, and will often be restless.  In addition on those days he tends to wake up by 2 or 3 "ready to go".  On weekends he usually takes a nap  in the afternoon for 1-1.5 hours.  Patient has some restless leg syndrome symptoms, feeling that he has to move his legs around a lot, this occurs more on nights when he goes to bed early.   He notes that he has been sleepy for the past 2-3 years. He drifts off often when idle at work, and has trouble staying awake while driving.   PMHX:   Past Medical History:  Diagnosis Date  . Atrial fibrillation (HCC)    once, diagnosed on ECG  . Chickenpox   . Chronic ankle pain   . Heart murmur   . Hyperlipidemia   . IBS (irritable bowel syndrome)    Mixed   Surgical Hx:  Past Surgical History:  Procedure Laterality Date  . TONSILLECTOMY AND ADENOIDECTOMY  1985   Family Hx:  Family History  Problem Relation Age of Onset  . Hyperlipidemia Mother   . Hypertension Mother   . Depression Mother   . Diabetes Mother   . Arthritis Father   . Hyperlipidemia Father   . Heart disease Father   . Stroke Father   . Depression Brother   . Hyperlipidemia Maternal Grandmother   . Hypertension Maternal Grandmother   . Depression Maternal Grandmother   . Hyperlipidemia Maternal Grandfather   . Stroke Maternal Grandfather   . Hypertension Maternal Grandfather   . Hyperlipidemia Paternal Grandmother   . Stroke Paternal  Grandmother   . Alcohol abuse Paternal Grandfather   . Arthritis Paternal Grandfather   . Hyperlipidemia Paternal Grandfather   . Hypertension Paternal Grandfather    Social Hx:   Social History   Tobacco Use  . Smoking status: Former Games developer  . Smokeless tobacco: Never Used  Substance Use Topics  . Alcohol use: Yes    Alcohol/week: 0.0 standard drinks    Comment: rarely  . Drug use: No   Medication:    Current Outpatient Medications:  .  Cats Claw 500 MG CAPS, , Disp: , Rfl:  .  fexofenadine (ALLEGRA) 180 MG tablet, Take 180 mg by mouth daily., Disp: , Rfl:  .  TURMERIC PO, Take 1,400 mg by mouth daily., Disp: , Rfl:    Allergies:  Patient has no known  allergies.  Review of Systems: Gen:  Denies  fever, sweats, chills HEENT: Denies blurred vision, double vision. bleeds, sore throat Cvc:  No dizziness, chest pain. Resp:   Denies cough or sputum production, shortness of breath Gi: Denies swallowing difficulty, stomach pain. Gu:  Denies bladder incontinence, burning urine Ext:   No Joint pain, stiffness. Skin: No skin rash,  hives  Endoc:  No polyuria, polydipsia. Psych: No depression, insomnia. Other:  All other systems were reviewed with the patient and were negative other that what is mentioned in the HPI.   Physical Examination:   VS: BP (!) 136/100 (BP Location: Left Arm, Cuff Size: Large)   Pulse 78   Resp 16   Ht 5\' 11"  (1.803 m)   Wt 256 lb (116.1 kg)   SpO2 98%   BMI 35.70 kg/m   General Appearance: No distress  Neuro:without focal findings,  speech normal,  HEENT: PERRLA, EOM intact.   Pulmonary: normal breath sounds, No wheezing.  CardiovascularNormal S1,S2.  No m/r/g.   Abdomen: Benign, Soft, non-tender. Renal:  No costovertebral tenderness  GU:  No performed at this time. Endoc: No evident thyromegaly, no signs of acromegaly. Skin:   warm, no rashes, no ecchymosis  Extremities: normal, no cyanosis, clubbing.  Other findings:    LABORATORY PANEL:   CBC Recent Labs  Lab 12/15/17 1055  WBC 9.2  HGB 15.7  HCT 44.3  PLT 260.0   ------------------------------------------------------------------------------------------------------------------  Chemistries  Recent Labs  Lab 12/15/17 1055  NA 140  K 4.3  CL 105  CO2 25  GLUCOSE 109*  BUN 16  CREATININE 1.14  CALCIUM 9.7  AST 18  ALT 25  ALKPHOS 56  BILITOT 0.6   ------------------------------------------------------------------------------------------------------------------  Cardiac Enzymes No results for input(s): TROPONINI in the last 168 hours. ------------------------------------------------------------  RADIOLOGY:  No results  found.     Thank  you for the consultation and for allowing [facility] to assist in the care of your patient. Our recommendations are noted above.  Please contact us if we can be of further service.   Wells Guiles, M.D., F.C.C.P.  Board Certified in Internal Medicine, Pulmonary Medicine, Critical Care Medicine, and Sleep Medicine.  [facility] Office Number: [phone]   12/16/2017

## 2017-12-17 DIAGNOSIS — G4733 Obstructive sleep apnea (adult) (pediatric): Secondary | ICD-10-CM | POA: Diagnosis not present

## 2017-12-18 ENCOUNTER — Institutional Professional Consult (permissible substitution): Payer: BLUE CROSS/BLUE SHIELD | Admitting: Internal Medicine

## 2017-12-19 DIAGNOSIS — G4733 Obstructive sleep apnea (adult) (pediatric): Secondary | ICD-10-CM | POA: Diagnosis not present

## 2017-12-20 LAB — TESTOS,TOTAL,FREE AND SHBG (FEMALE)
Free Testosterone: 84.4 pg/mL (ref 35.0–155.0)
Sex Hormone Binding: 22 nmol/L (ref 10–50)
Testosterone, Total, LC-MS-MS: 404 ng/dL (ref 250–1100)

## 2017-12-23 ENCOUNTER — Telehealth: Payer: Self-pay | Admitting: *Deleted

## 2017-12-23 DIAGNOSIS — G4719 Other hypersomnia: Secondary | ICD-10-CM

## 2017-12-23 DIAGNOSIS — G4733 Obstructive sleep apnea (adult) (pediatric): Secondary | ICD-10-CM

## 2017-12-23 NOTE — Telephone Encounter (Signed)
Patient aware of results HST. Pt refused cpap therefore referral to dentistry entered. Nothing further needed.

## 2018-01-20 NOTE — Addendum Note (Signed)
Addended by: Mayari Matus A on: 01/20/2018 02:10 PM   Modules accepted: Orders  

## 2018-02-03 ENCOUNTER — Ambulatory Visit: Payer: BLUE CROSS/BLUE SHIELD | Admitting: Primary Care

## 2018-02-03 ENCOUNTER — Encounter: Payer: Self-pay | Admitting: Primary Care

## 2018-02-03 VITALS — BP 122/80 | HR 80 | Temp 98.4°F | Ht 71.0 in | Wt 258.5 lb

## 2018-02-03 DIAGNOSIS — J22 Unspecified acute lower respiratory infection: Secondary | ICD-10-CM | POA: Diagnosis not present

## 2018-02-03 MED ORDER — HYDROCOD POLST-CPM POLST ER 10-8 MG/5ML PO SUER: 5.0000 mL | Freq: Two times a day (BID) | ORAL | 0 refills | Status: DC | PRN

## 2018-02-03 MED ORDER — AZITHROMYCIN 250 MG PO TABS: ORAL_TABLET | ORAL | 0 refills | Status: DC

## 2018-02-03 NOTE — Patient Instructions (Signed)
Start Azithromycin antibiotics for infection. Take 2 tablets by mouth today, then 1 tablet daily for 4 additional days.  You may take the cough suppressant every 12 hours as needed for cough and rest. Caution this medication contains codeine which may cause drowsiness.   You may use Dayquil, Robitussin, or Delsym for daytime cough.  Make sure to drink plenty of water.   It was a pleasure to see you today!

## 2018-02-03 NOTE — Progress Notes (Signed)
Subjective:    Patient ID: Stephen Sweeney, male    DOB: Apr 28, 1975, 43 y.o.   MRN: [id number]  HPI  Mr. Divelbiss is a 43 year old male with a history of allergic rhinitis who presents today with a chief complaint of cough.   He also reports rhinorrhea, nasal congestion, watery eyes, sore throat from cough, chest congestion. His symptoms began two weeks ago, worse at night. Cough is present during the day and evening and is non productive.  He's tried Aleve Cold and Sinus, Mucinex, Robitussin, Dayquil, Nyquil without improvement. He has been around his daughter who had an upper respiratory infection. He had his flu shot.   Review of Systems  Constitutional: Positive for fatigue. Negative for fever.  HENT: Positive for congestion, postnasal drip, rhinorrhea and sore throat. Negative for ear pain.   Respiratory: Positive for cough.        Past Medical History:  Diagnosis Date  . Atrial fibrillation (HCC)    once, diagnosed on ECG  . Chickenpox   . Chronic ankle pain   . Heart murmur   . Hyperlipidemia   . IBS (irritable bowel syndrome)    Mixed     Social History   Socioeconomic History  . Marital status: Married    Spouse name: Not on file  . Number of children: Not on file  . Years of education: Not on file  . Highest education level: Not on file  Occupational History  . Not on file  Social Needs  . Financial resource strain: Not on file  . Food insecurity:    Worry: Not on file    Inability: Not on file  . Transportation needs:    Medical: Not on file    Non-medical: Not on file  Tobacco Use  . Smoking status: Former Games developer  . Smokeless tobacco: Never Used  Substance and Sexual Activity  . Alcohol use: Yes    Alcohol/week: 0.0 standard drinks    Comment: rarely  . Drug use: No  . Sexual activity: Not on file  Lifestyle  . Physical activity:    Days per week: Not on file    Minutes per session: Not on file  . Stress: Not on file  Relationships  . Social  connections:    Talks on phone: Not on file    Gets together: Not on file    Attends religious service: Not on file    Active member of club or organization: Not on file    Attends meetings of clubs or organizations: Not on file    Relationship status: Not on file  . Intimate partner violence:    Fear of current or ex partner: Not on file    Emotionally abused: Not on file    Physically abused: Not on file    Forced sexual activity: Not on file  Other Topics Concern  . Not on file  Social History Narrative   Married.   2 children.   Works in Consulting civil engineer.   Enjoys playing video games.    Past Surgical History:  Procedure Laterality Date  . TONSILLECTOMY AND ADENOIDECTOMY  1985    Family History  Problem Relation Age of Onset  . Hyperlipidemia Mother   . Hypertension Mother   . Depression Mother   . Diabetes Mother   . Arthritis Father   . Hyperlipidemia Father   . Heart disease Father   . Stroke Father   . Depression Brother   . Hyperlipidemia Maternal Grandmother   .  Hypertension Maternal Grandmother   . Depression Maternal Grandmother   . Hyperlipidemia Maternal Grandfather   . Stroke Maternal Grandfather   . Hypertension Maternal Grandfather   . Hyperlipidemia Paternal Grandmother   . Stroke Paternal Grandmother   . Alcohol abuse Paternal Grandfather   . Arthritis Paternal Grandfather   . Hyperlipidemia Paternal Grandfather   . Hypertension Paternal Grandfather     No Known Allergies  Current Outpatient Medications on File Prior to Visit  Medication Sig Dispense Refill  . Cats Claw 500 MG CAPS     . fexofenadine (ALLEGRA) 180 MG tablet Take 180 mg by mouth daily.    . TURMERIC PO Take 1,400 mg by mouth daily.     No current facility-administered medications on file prior to visit.     BP 122/80   Pulse 80   Temp 98.4 F (36.9 C) (Oral)   Ht 5\' 11"  (1.803 m)   Wt 258 lb 8 oz (117.3 kg)   SpO2 98%   BMI 36.05 kg/m    Objective:   Physical Exam    Constitutional: He appears well-nourished. He does not appear ill.  HENT:  Right Ear: Tympanic membrane and ear canal normal.  Left Ear: Tympanic membrane and ear canal normal.  Nose: Mucosal edema present. Right sinus exhibits no maxillary sinus tenderness and no frontal sinus tenderness. Left sinus exhibits no maxillary sinus tenderness and no frontal sinus tenderness.  Mouth/Throat: Oropharynx is clear and moist.  Neck: Neck supple.  Cardiovascular: Normal rate and regular rhythm.  Respiratory: Effort normal and breath sounds normal. He has no wheezes.  Persistent dry cough during exam.  Skin: Skin is warm and dry.           Assessment & Plan:  Acute lower respiratory infection:  Persistent cough x2 weeks also with symptoms of congestion, fatigue, rhinorrhea. Cough has progressed and is now worse. Exam today with overall clear lungs but persistent nagging cough. Given duration of symptoms coupled with presentation will treat for potential early bacterial cause. Prescription for Z-Pak and test next cough suppressant sent to pharmacy. Discussed to use DayQuil during the day for daytime cough. Fluids, rest, follow-up PRN.  Doreene Nest, NP

## 2018-03-13 ENCOUNTER — Ambulatory Visit: Payer: BLUE CROSS/BLUE SHIELD | Admitting: Primary Care

## 2018-03-13 VITALS — BP 120/82 | HR 85 | Temp 98.5°F | Ht 71.0 in | Wt 256.2 lb

## 2018-03-13 DIAGNOSIS — J019 Acute sinusitis, unspecified: Secondary | ICD-10-CM | POA: Diagnosis not present

## 2018-03-13 DIAGNOSIS — B9789 Other viral agents as the cause of diseases classified elsewhere: Secondary | ICD-10-CM | POA: Diagnosis not present

## 2018-03-13 NOTE — Progress Notes (Signed)
Subjective:    Patient ID: Stephen Sweeney, male    DOB: 1975/03/01, 43 y.o.   MRN: [id number]  HPI  Stephen Sweeney is a 43 year old male with a history of allergic rhinitis who presents today with a chief complaint of nasal congestion.   He also reports mild cough, sore throat, sinus pressure. His symptoms began 3 days ago. His cough is productive with green/yellow sputum. He's been taking Aleve Cold and Sinus and Allegra with some improvement. He denies fevers, sick contacts. Not using Flonase.  Review of Systems  Constitutional: Negative for chills, fatigue and fever.  HENT: Positive for congestion, postnasal drip, sinus pressure, sinus pain and sore throat. Negative for ear pain.   Respiratory: Positive for cough. Negative for shortness of breath.   Neurological: Positive for headaches.       Past Medical History:  Diagnosis Date  . Atrial fibrillation (HCC)    once, diagnosed on ECG  . Chickenpox   . Chronic ankle pain   . Heart murmur   . Hyperlipidemia   . IBS (irritable bowel syndrome)    Mixed     Social History   Socioeconomic History  . Marital status: Married    Spouse name: Not on file  . Number of children: Not on file  . Years of education: Not on file  . Highest education level: Not on file  Occupational History  . Not on file  Social Needs  . Financial resource strain: Not on file  . Food insecurity:    Worry: Not on file    Inability: Not on file  . Transportation needs:    Medical: Not on file    Non-medical: Not on file  Tobacco Use  . Smoking status: Former Games developer  . Smokeless tobacco: Never Used  Substance and Sexual Activity  . Alcohol use: Yes    Alcohol/week: 0.0 standard drinks    Comment: rarely  . Drug use: No  . Sexual activity: Not on file  Lifestyle  . Physical activity:    Days per week: Not on file    Minutes per session: Not on file  . Stress: Not on file  Relationships  . Social connections:    Talks on phone: Not on file      Gets together: Not on file    Attends religious service: Not on file    Active member of club or organization: Not on file    Attends meetings of clubs or organizations: Not on file    Relationship status: Not on file  . Intimate partner violence:    Fear of current or ex partner: Not on file    Emotionally abused: Not on file    Physically abused: Not on file    Forced sexual activity: Not on file  Other Topics Concern  . Not on file  Social History Narrative   Married.   2 children.   Works in Consulting civil engineer.   Enjoys playing video games.    Past Surgical History:  Procedure Laterality Date  . TONSILLECTOMY AND ADENOIDECTOMY  1985    Family History  Problem Relation Age of Onset  . Hyperlipidemia Mother   . Hypertension Mother   . Depression Mother   . Diabetes Mother   . Arthritis Father   . Hyperlipidemia Father   . Heart disease Father   . Stroke Father   . Depression Brother   . Hyperlipidemia Maternal Grandmother   . Hypertension Maternal Grandmother   .  Depression Maternal Grandmother   . Hyperlipidemia Maternal Grandfather   . Stroke Maternal Grandfather   . Hypertension Maternal Grandfather   . Hyperlipidemia Paternal Grandmother   . Stroke Paternal Grandmother   . Alcohol abuse Paternal Grandfather   . Arthritis Paternal Grandfather   . Hyperlipidemia Paternal Grandfather   . Hypertension Paternal Grandfather     No Known Allergies  Current Outpatient Medications on File Prior to Visit  Medication Sig Dispense Refill  . Cats Claw 500 MG CAPS     . fexofenadine (ALLEGRA) 180 MG tablet Take 180 mg by mouth daily.    . TURMERIC PO Take 1,400 mg by mouth daily.     No current facility-administered medications on file prior to visit.     BP 120/82   Pulse 85   Temp 98.5 F (36.9 C) (Oral)   Ht 5\' 11"  (1.803 m)   Wt 256 lb 4 oz (116.2 kg)   SpO2 98%   BMI 35.74 kg/m    Objective:   Physical Exam  Constitutional: He appears well-nourished. He does  not appear ill.  HENT:  Right Ear: Tympanic membrane and ear canal normal.  Left Ear: Tympanic membrane and ear canal normal.  Nose: Mucosal edema present. Right sinus exhibits maxillary sinus tenderness. Right sinus exhibits no frontal sinus tenderness. Left sinus exhibits maxillary sinus tenderness. Left sinus exhibits no frontal sinus tenderness.  Mouth/Throat: Oropharynx is clear and moist.  Neck: Neck supple.  Cardiovascular: Normal rate and regular rhythm.  Respiratory: Effort normal and breath sounds normal. He has no wheezes.  Skin: Skin is warm and dry.           Assessment & Plan:  Viral Sinusitis vs URI:  Symptoms x 3 days, some improvement with OTC treatment. Exam and HPI today not consistent for bacterial involvement, clear lungs, doesn't appear sickly. Suspect viral etiology and will treat with conservative measurers. Discussed to continue Allegra, start nasal sprays (flonase vs saline), delsym/robtitussin PRN. Fluids, rest, follow up PRN.  Doreene Nest, NP

## 2018-03-13 NOTE — Patient Instructions (Signed)
Your symptoms are representative of a viral illness which will resolve on its own over time. Our goal is to treat your symptoms in order to aid your body in the healing process and to make you more comfortable.   You can try a few things over the counter to help with your symptoms including:  Cough: Delsym or Robitussin (get the off brand, works just as well) Chest Congestion: Mucinex (plain) Nasal Congestion/Ear Pressure/Sinus Pressure: Try using Flonase (fluticasone) nasal spray. Instill 1 spray in each nostril twice daily. This can be purchased over the counter. Ibuprofen or Tylenol: Body aches, fevers, headache.  Please update me mid next week if your symptoms become worse.  It was a pleasure to see you today!

## 2018-06-12 DIAGNOSIS — G4733 Obstructive sleep apnea (adult) (pediatric): Secondary | ICD-10-CM | POA: Diagnosis not present

## 2018-07-27 ENCOUNTER — Telehealth: Payer: Self-pay | Admitting: Primary Care

## 2018-07-27 NOTE — Telephone Encounter (Signed)
Called patient regarding MyChart request. Need to find out what other issues are that patient is experiencing and get him scheduled appropriately. See request below:  Appointment Request From: Stephen Sweeney    With Provider: Pleas Koch, NP [facility] [place]    Preferred Date Range: 07/28/2018 - 07/29/2018    Preferred Times: Any Time    Reason for visit: Request an Appointment    Comments:  Due for yearly physical also issue that I would rather not list through this request.

## 2018-08-06 ENCOUNTER — Ambulatory Visit: Payer: BC Managed Care – PPO | Admitting: Primary Care

## 2018-08-06 ENCOUNTER — Other Ambulatory Visit: Payer: Self-pay

## 2018-08-06 ENCOUNTER — Telehealth: Payer: Self-pay

## 2018-08-06 ENCOUNTER — Encounter: Payer: Self-pay | Admitting: Primary Care

## 2018-08-06 VITALS — BP 124/84 | HR 78 | Temp 98.1°F | Ht 71.0 in | Wt 259.2 lb

## 2018-08-06 DIAGNOSIS — G4730 Sleep apnea, unspecified: Secondary | ICD-10-CM

## 2018-08-06 DIAGNOSIS — E785 Hyperlipidemia, unspecified: Secondary | ICD-10-CM

## 2018-08-06 DIAGNOSIS — R739 Hyperglycemia, unspecified: Secondary | ICD-10-CM

## 2018-08-06 DIAGNOSIS — N529 Male erectile dysfunction, unspecified: Secondary | ICD-10-CM

## 2018-08-06 MED ORDER — SILDENAFIL CITRATE 20 MG PO TABS: ORAL_TABLET | ORAL | 0 refills | Status: DC

## 2018-08-06 NOTE — Progress Notes (Signed)
Subjective:    Patient ID: Stephen Sweeney, male    DOB: 1975/05/12, 43 y.o.   MRN: [id number]  HPI  Stephen Sweeney is a 42 year old male who presents today to discuss erectile dysfunction.   Chronic for nearly one year. He has since completed a sleep study, has a sleep apnea apparatus and is doing much better with daytime drowsiness but continues to have erectile dysfunction issues. Sleep apnea was thought to be contributing to erectile dysfunction at the time but he's not noticed any improvement.   He is having difficulty obtaining and maintaining an erection which has been problematic for the last 9 months. This occurs most every time he and his wife have intercourse.   He denies chest pain, dizziness, fatigue.   Review of Systems  Constitutional: Negative for fatigue.  Respiratory: Negative for shortness of breath.   Cardiovascular: Negative for chest pain.  Genitourinary:       Erectile dysfunction   Neurological: Negative for dizziness.       Past Medical History:  Diagnosis Date  . Atrial fibrillation ([place])    once, diagnosed on ECG  . Chickenpox   . Chronic ankle pain   . Heart murmur   . Hyperlipidemia   . IBS (irritable bowel syndrome)    Mixed     Social History   Socioeconomic History  . Marital status: Married    Spouse name: Not on file  . Number of children: Not on file  . Years of education: Not on file  . Highest education level: Not on file  Occupational History  . Not on file  Social Needs  . Financial resource strain: Not on file  . Food insecurity    Worry: Not on file    Inability: Not on file  . Transportation needs    Medical: Not on file    Non-medical: Not on file  Tobacco Use  . Smoking status: Former Research scientist (life sciences)  . Smokeless tobacco: Never Used  Substance and Sexual Activity  . Alcohol use: Yes    Alcohol/week: 0.0 standard drinks    Comment: rarely  . Drug use: No  . Sexual activity: Not on file  Lifestyle  . Physical activity   Days per week: Not on file    Minutes per session: Not on file  . Stress: Not on file  Relationships  . Social Herbalist on phone: Not on file    Gets together: Not on file    Attends religious service: Not on file    Active member of club or organization: Not on file    Attends meetings of clubs or organizations: Not on file    Relationship status: Not on file  . Intimate partner violence    Fear of current or ex partner: Not on file    Emotionally abused: Not on file    Physically abused: Not on file    Forced sexual activity: Not on file  Other Topics Concern  . Not on file  Social History Narrative   Married.   2 children.   Works in Engineer, technical sales.   Enjoys playing video games.    Past Surgical History:  Procedure Laterality Date  . TONSILLECTOMY AND ADENOIDECTOMY  1985    Family History  Problem Relation Age of Onset  . Hyperlipidemia Mother   . Hypertension Mother   . Depression Mother   . Diabetes Mother   . Arthritis Father   . Hyperlipidemia Father   .  Heart disease Father   . Stroke Father   . Depression Brother   . Hyperlipidemia Maternal Grandmother   . Hypertension Maternal Grandmother   . Depression Maternal Grandmother   . Hyperlipidemia Maternal Grandfather   . Stroke Maternal Grandfather   . Hypertension Maternal Grandfather   . Hyperlipidemia Paternal Grandmother   . Stroke Paternal Grandmother   . Alcohol abuse Paternal Grandfather   . Arthritis Paternal Grandfather   . Hyperlipidemia Paternal Grandfather   . Hypertension Paternal Grandfather     No Known Allergies  Current Outpatient Medications on File Prior to Visit  Medication Sig Dispense Refill  . CALCIUM PO Take by mouth.    . Cats Claw 500 MG CAPS     . fexofenadine (ALLEGRA) 180 MG tablet Take 180 mg by mouth daily.    . TURMERIC PO Take 1,400 mg by mouth daily.     No current facility-administered medications on file prior to visit.     BP 124/84   Pulse 78   Temp 98.1  F (36.7 C) (Temporal)   Ht 5\' 11"  (1.803 m)   Wt 259 lb 4 oz (117.6 kg)   SpO2 98%   BMI 36.16 kg/m    Objective:   Physical Exam  Constitutional: He is oriented to person, place, and time. He appears well-nourished.  Cardiovascular: Normal rate.  Respiratory: Effort normal.  Neurological: He is alert and oriented to person, place, and time.  Psychiatric: He has a normal mood and affect.           Assessment & Plan:

## 2018-08-06 NOTE — Telephone Encounter (Signed)
[name] - Client Nonclinical Telephone Record AccessNurse Client [name] - Client Client Site [place] Physician Alma Friendly - NP Contact Type Call Who Is Calling Patient / Member / Family / Caregiver Caller Name Dakhari Zuver Caller Phone Number [phone] Patient Name Stephen Sweeney Patient DOB 11/09/1975 Call Type Message Only Information Provided Reason for Call Request for General Office Information Initial Comment Caller is at the office for his appointment. Additional Comment Office hours were provided. Call Closed By: Jaclyn Prime Transaction Date/Time: 08/06/2018 7:06:59 AM (ET)

## 2018-08-06 NOTE — Assessment & Plan Note (Signed)
Chronic for nearly the last one year, no improvement in symptoms after treated for sleep apnea.  Labs from December 2019 without obvious cause. Repeat labs pending. Encouraged weight loss.  Referral placed to Urology for evaluation. Rx for Sildenafil 20 mg sent to pharmacy, instructions provided for use.

## 2018-08-06 NOTE — Patient Instructions (Signed)
You will be contacted regarding your referral to Urology.  Please let us know if you have not been contacted within one week.   You can take the sildenafil 30 minutes prior to intercourse, start with two tablets first, can increase to a max of 5 tablets.   It was a pleasure to see you today!

## 2018-08-06 NOTE — Assessment & Plan Note (Signed)
Doing well with sleep apparatus. Continue same.

## 2018-08-06 NOTE — Telephone Encounter (Signed)
Per pts chart pt has already had a visit this morning.

## 2018-08-17 ENCOUNTER — Other Ambulatory Visit: Payer: Self-pay

## 2018-08-17 ENCOUNTER — Other Ambulatory Visit (INDEPENDENT_AMBULATORY_CARE_PROVIDER_SITE_OTHER): Payer: BC Managed Care – PPO

## 2018-08-17 DIAGNOSIS — E785 Hyperlipidemia, unspecified: Secondary | ICD-10-CM

## 2018-08-17 DIAGNOSIS — N529 Male erectile dysfunction, unspecified: Secondary | ICD-10-CM

## 2018-08-17 DIAGNOSIS — R739 Hyperglycemia, unspecified: Secondary | ICD-10-CM

## 2018-08-17 LAB — COMPREHENSIVE METABOLIC PANEL
ALT: 30 U/L (ref 0–53)
AST: 19 U/L (ref 0–37)
Albumin: 4.9 g/dL (ref 3.5–5.2)
Alkaline Phosphatase: 54 U/L (ref 39–117)
BUN: 18 mg/dL (ref 6–23)
CO2: 27 mEq/L (ref 19–32)
Calcium: 9.5 mg/dL (ref 8.4–10.5)
Chloride: 105 mEq/L (ref 96–112)
Creatinine, Ser: 1.13 mg/dL (ref 0.40–1.50)
GFR: 70.32 mL/min (ref >60.00)
Glucose, Bld: 109 mg/dL — ABNORMAL HIGH (ref 70–99)
Potassium: 4.6 mEq/L (ref 3.5–5.1)
Sodium: 141 mEq/L (ref 135–145)
Total Bilirubin: 0.7 mg/dL (ref 0.2–1.2)
Total Protein: 6.8 g/dL (ref 6.0–8.3)

## 2018-08-17 LAB — LIPID PANEL
Cholesterol: 204 mg/dL — ABNORMAL HIGH (ref 0–200)
HDL: 36.4 mg/dL — ABNORMAL LOW (ref >39.00)
LDL Cholesterol: 137 mg/dL — ABNORMAL HIGH (ref 0–99)
NonHDL: 167.38
Total CHOL/HDL Ratio: 6
Triglycerides: 150 mg/dL — ABNORMAL HIGH (ref 0.0–149.0)
VLDL: 30 mg/dL (ref 0.0–40.0)

## 2018-08-17 LAB — HEMOGLOBIN A1C: Hgb A1c MFr Bld: 5.1 % (ref 4.6–6.5)

## 2018-08-17 LAB — TESTOSTERONE: Testosterone: 342.42 ng/dL (ref 300.00–890.00)

## 2018-08-17 LAB — PSA: PSA: 0.4 ng/mL (ref 0.10–4.00)

## 2018-08-26 ENCOUNTER — Ambulatory Visit (INDEPENDENT_AMBULATORY_CARE_PROVIDER_SITE_OTHER): Payer: BC Managed Care – PPO | Admitting: Primary Care

## 2018-08-26 ENCOUNTER — Other Ambulatory Visit: Payer: Self-pay

## 2018-08-26 VITALS — BP 124/80 | HR 80 | Temp 98.3°F | Ht 71.0 in | Wt 261.5 lb

## 2018-08-26 DIAGNOSIS — J302 Other seasonal allergic rhinitis: Secondary | ICD-10-CM | POA: Diagnosis not present

## 2018-08-26 DIAGNOSIS — Z0000 Encounter for general adult medical examination without abnormal findings: Secondary | ICD-10-CM

## 2018-08-26 DIAGNOSIS — G4730 Sleep apnea, unspecified: Secondary | ICD-10-CM | POA: Diagnosis not present

## 2018-08-26 DIAGNOSIS — E785 Hyperlipidemia, unspecified: Secondary | ICD-10-CM

## 2018-08-26 DIAGNOSIS — N529 Male erectile dysfunction, unspecified: Secondary | ICD-10-CM | POA: Diagnosis not present

## 2018-08-26 NOTE — Patient Instructions (Signed)
Start exercising. You should be getting 150 minutes of moderate intensity exercise weekly.  It's important to improve your diet by reducing consumption of fast food, fried food, processed snack foods, sugary drinks. Increase consumption of fresh vegetables and fruits, whole grains, water.  Ensure you are drinking 64 ounces of water daily.  It was a pleasure to see you today!   Preventive Care 110-44 Years Old, Male Preventive care refers to lifestyle choices and visits with your health care provider that can promote health and wellness. This includes:  A yearly physical exam. This is also called an annual well check.  Regular dental and eye exams.  Immunizations.  Screening for certain conditions.  Healthy lifestyle choices, such as eating a healthy diet, getting regular exercise, not using drugs or products that contain nicotine and tobacco, and limiting alcohol use. What can I expect for my preventive care visit? Physical exam Your health care provider will check:  Height and weight. These may be used to calculate body mass index (BMI), which is a measurement that tells if you are at a healthy weight.  Heart rate and blood pressure.  Your skin for abnormal spots. Counseling Your health care provider may ask you questions about:  Alcohol, tobacco, and drug use.  Emotional well-being.  Home and relationship well-being.  Sexual activity.  Eating habits.  Work and work Statistician. What immunizations do I need?  Influenza (flu) vaccine  This is recommended every year. Tetanus, diphtheria, and pertussis (Tdap) vaccine  You may need a Td booster every 10 years. Varicella (chickenpox) vaccine  You may need this vaccine if you have not already been vaccinated. Zoster (shingles) vaccine  You may need this after age 82. Measles, mumps, and rubella (MMR) vaccine  You may need at least one dose of MMR if you were born in 1957 or later. You may also need a second  dose. Pneumococcal conjugate (PCV13) vaccine  You may need this if you have certain conditions and were not previously vaccinated. Pneumococcal polysaccharide (PPSV23) vaccine  You may need one or two doses if you smoke cigarettes or if you have certain conditions. Meningococcal conjugate (MenACWY) vaccine  You may need this if you have certain conditions. Hepatitis A vaccine  You may need this if you have certain conditions or if you travel or work in places where you may be exposed to hepatitis A. Hepatitis B vaccine  You may need this if you have certain conditions or if you travel or work in places where you may be exposed to hepatitis B. Haemophilus influenzae type b (Hib) vaccine  You may need this if you have certain risk factors. Human papillomavirus (HPV) vaccine  If recommended by your health care provider, you may need three doses over 6 months. You may receive vaccines as individual doses or as more than one vaccine together in one shot (combination vaccines). Talk with your health care provider about the risks and benefits of combination vaccines. What tests do I need? Blood tests  Lipid and cholesterol levels. These may be checked every 5 years, or more frequently if you are over 46 years old.  Hepatitis C test.  Hepatitis B test. Screening  Lung cancer screening. You may have this screening every year starting at age 43 if you have a 30-pack-year history of smoking and currently smoke or have quit within the past 15 years.  Prostate cancer screening. Recommendations will vary depending on your family history and other risks.  Colorectal cancer screening. All  adults should have this screening starting at age 66 and continuing until age 29. Your health care provider may recommend screening at age 18 if you are at increased risk. You will have tests every 1-10 years, depending on your results and the type of screening test.  Diabetes screening. This is done by  checking your blood sugar (glucose) after you have not eaten for a while (fasting). You may have this done every 1-3 years.  Sexually transmitted disease (STD) testing. Follow these instructions at home: Eating and drinking  Eat a diet that includes fresh fruits and vegetables, whole grains, lean protein, and low-fat dairy products.  Take vitamin and mineral supplements as recommended by your health care provider.  Do not drink alcohol if your health care provider tells you not to drink.  If you drink alcohol: ? Limit how much you have to 0-2 drinks a day. ? Be aware of how much alcohol is in your drink. In the [place] one drink equals one 12 oz bottle of beer (355 mL), one 5 oz glass of wine (148 mL), or one 1 oz glass of hard liquor (44 mL). Lifestyle  Take daily care of your teeth and gums.  Stay active. Exercise for at least 30 minutes on 5 or more days each week.  Do not use any products that contain nicotine or tobacco, such as cigarettes, e-cigarettes, and chewing tobacco. If you need help quitting, ask your health care provider.  If you are sexually active, practice safe sex. Use a condom or other form of protection to prevent STIs (sexually transmitted infections).  Talk with your health care provider about taking a low-dose aspirin every day starting at age 26. What's next?  Go to your health care provider once a year for a well check visit.  Ask your health care provider how often you should have your eyes and teeth checked.  Stay up to date on all vaccines. This information is not intended to replace advice given to you by your health care provider. Make sure you discuss any questions you have with your health care provider. Document Released: 01/20/2015 Document Revised: 12/18/2017 Document Reviewed: 12/18/2017 Elsevier Patient Education  2020 [employer].

## 2018-08-26 NOTE — Assessment & Plan Note (Signed)
Not using sildenafil as of yet, continue as needed.

## 2018-08-26 NOTE — Assessment & Plan Note (Signed)
LDL of 137 on recent labs which is above goal.  Encouraged regular exercise, healthy diet.  Repeat next year.

## 2018-08-26 NOTE — Progress Notes (Signed)
Subjective:    Patient ID: Stephen Sweeney, male    DOB: [birth year], 89 y.o.   MRN: [id number]  HPI  Stephen Sweeney is a 43 year old male who presents today for complete physical.  Immunizations: -Tetanus: Completed in 2017 -Influenza: Due this season   Diet: He endorses a fair diet. He mostly eats chicken/pork chops, occasional hot dogs tacos, some vegetables. Desserts 4 days weekly. Drinking mostly soda and water, coffee.  Exercise: He is not exercising.  Eye exam: Due this month, he will schedule.  Dental exam: Completes every 3 months   BP Readings from Last 3 Encounters:  08/26/18 124/80  08/06/18 124/84  03/13/18 120/82     Review of Systems  Constitutional: Negative for unexpected weight change.  HENT: Negative for rhinorrhea.   Respiratory: Negative for cough and shortness of breath.   Cardiovascular: Negative for chest pain.  Gastrointestinal: Negative for constipation and diarrhea.  Genitourinary: Negative for difficulty urinating.  Musculoskeletal: Negative for arthralgias and myalgias.  Skin: Negative for rash.  Allergic/Immunologic: Negative for environmental allergies.  Neurological: Negative for dizziness and headaches.  Psychiatric/Behavioral: Negative for sleep disturbance. The patient is not nervous/anxious.        Past Medical History:  Diagnosis Date  . Atrial fibrillation (HCC)    once, diagnosed on ECG  . Chickenpox   . Chronic ankle pain   . Heart murmur   . Hyperlipidemia   . IBS (irritable bowel syndrome)    Mixed     Social History   Socioeconomic History  . Marital status: Married    Spouse name: Not on file  . Number of children: Not on file  . Years of education: Not on file  . Highest education level: Not on file  Occupational History  . Not on file  Social Needs  . Financial resource strain: Not on file  . Food insecurity    Worry: Not on file    Inability: Not on file  . Transportation needs    Medical: Not on file   Non-medical: Not on file  Tobacco Use  . Smoking status: Former Games developermoker  . Smokeless tobacco: Never Used  Substance and Sexual Activity  . Alcohol use: Yes    Alcohol/week: 0.0 standard drinks    Comment: rarely  . Drug use: No  . Sexual activity: Not on file  Lifestyle  . Physical activity    Days per week: Not on file    Minutes per session: Not on file  . Stress: Not on file  Relationships  . Social Musicianconnections    Talks on phone: Not on file    Gets together: Not on file    Attends religious service: Not on file    Active member of club or organization: Not on file    Attends meetings of clubs or organizations: Not on file    Relationship status: Not on file  . Intimate partner violence    Fear of current or ex partner: Not on file    Emotionally abused: Not on file    Physically abused: Not on file    Forced sexual activity: Not on file  Other Topics Concern  . Not on file  Social History Narrative   Married.   2 children.   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    Past Surgical History:  Procedure Laterality Date  . TONSILLECTOMY AND ADENOIDECTOMY  1985    Family History  Problem Relation Age of Onset  .  Hyperlipidemia Mother   . Hypertension Mother   . Depression Mother   . Diabetes Mother   . Arthritis Father   . Hyperlipidemia Father   . Heart disease Father   . Stroke Father   . Depression Brother   . Hyperlipidemia Maternal Grandmother   . Hypertension Maternal Grandmother   . Depression Maternal Grandmother   . Hyperlipidemia Maternal Grandfather   . Stroke Maternal Grandfather   . Hypertension Maternal Grandfather   . Hyperlipidemia Paternal Grandmother   . Stroke Paternal Grandmother   . Alcohol abuse Paternal Grandfather   . Arthritis Paternal Grandfather   . Hyperlipidemia Paternal Grandfather   . Hypertension Paternal Grandfather     No Known Allergies  Current Outpatient Medications on File Prior to Visit  Medication Sig  Dispense Refill  . CALCIUM PO Take by mouth.    . Cats Claw 500 MG CAPS     . fexofenadine (ALLEGRA) 180 MG tablet Take 180 mg by mouth daily.    . sildenafil (REVATIO) 20 MG tablet Take 2-5 tablets by mouth 30 minutes prior to intercourse as needed. 50 tablet 0  . TURMERIC PO Take 1,400 mg by mouth daily.     No current facility-administered medications on file prior to visit.     BP 124/80   Pulse 80   Temp 98.3 F (36.8 C) (Temporal)   Ht 5\' 11"  (1.803 m)   Wt 261 lb 8 oz (118.6 kg)   SpO2 97%   BMI 36.47 kg/m    Objective:   Physical Exam  Constitutional: He is oriented to person, place, and time. He appears well-nourished.  HENT:  Mouth/Throat: No oropharyngeal exudate.  Eyes: Pupils are equal, round, and reactive to light. EOM are normal.  Neck: Neck supple. No thyromegaly present.  Cardiovascular: Normal rate and regular rhythm.  Respiratory: Effort normal and breath sounds normal.  GI: Soft. Bowel sounds are normal. There is no abdominal tenderness.  Musculoskeletal: Normal range of motion.  Neurological: He is alert and oriented to person, place, and time.  Skin: Skin is warm and dry.  Psychiatric: He has a normal mood and affect.           Assessment & Plan:

## 2018-08-26 NOTE — Assessment & Plan Note (Signed)
Doing well on sleeping apparatus, continue same.

## 2018-08-26 NOTE — Assessment & Plan Note (Signed)
Tetanus UTD, influenza vaccination due this Fall. Discussed the importance of a healthy diet and regular exercise in order for weight loss, and to reduce the risk of any potential medical problems. Exam unremarkable. Labs reviewed. Follow up in 1 year for CPE.

## 2018-08-26 NOTE — Assessment & Plan Note (Signed)
Chronic, doing well on fexofenadine.  Continue same.

## 2018-09-24 ENCOUNTER — Ambulatory Visit: Payer: BC Managed Care – PPO | Admitting: Urology

## 2018-09-24 ENCOUNTER — Other Ambulatory Visit: Payer: Self-pay

## 2018-09-24 ENCOUNTER — Encounter: Payer: Self-pay | Admitting: Urology

## 2018-09-24 VITALS — BP 138/88 | HR 73 | Ht 71.0 in | Wt 258.8 lb

## 2018-09-24 DIAGNOSIS — N5202 Corporo-venous occlusive erectile dysfunction: Secondary | ICD-10-CM | POA: Diagnosis not present

## 2018-09-24 NOTE — Progress Notes (Signed)
09/24/2018 11:01 AM   Waynetta Peanoger Adel 1975-03-23 [id number]  Referring provider: Doreene Nestlark, Katherine K, NP [address] [place] [id number]  Chief Complaint  Patient presents with  . Erectile Dysfunction    HPI: 43 y.o. male seen in consultation at the request of Vernona RiegerKatherine Clark, NP for evaluation of erectile dysfunction.  He presents with a 06-7775-month history of difficulty maintaining an erection.  He is able to achieve firm erections that are perceived normal and no problems with penetration however he will typically lose the erection within 1-2 minutes.  He can regain the erection with additional foreplay however well lose again with penetration.  He denies pain or curvature with erections.  He has good libido.  He was having tiredness and fatigue but had been diagnosed with sleep apnea and uses a sleep appliance with improvement in his fatigue.  A testosterone level on 08/17/2018 was low normal at 342.  PSA was 0.4.  A total testosterone level December 2019 was 404.  SHIM score was 17/25 indicating mild ED. He was given a trial of generic sildenafil by his PCP.  He took a 40 mg dose and had side effects of tachycardia and flushing.  He saw no improvement.   PMH: Past Medical History:  Diagnosis Date  . Atrial fibrillation (HCC)    once, diagnosed on ECG  . Chickenpox   . Chronic ankle pain   . Heart murmur   . Hyperlipidemia   . IBS (irritable bowel syndrome)    Mixed    Surgical History: Past Surgical History:  Procedure Laterality Date  . TONSILLECTOMY AND ADENOIDECTOMY  1985    Home Medications:  Allergies as of 09/24/2018   No Known Allergies     Medication List       Accurate as of September 24, 2018 11:01 AM. If you have any questions, ask your nurse or doctor.        CALCIUM PO Take by mouth.   Cats Claw 500 MG Caps   fexofenadine 180 MG tablet Commonly known as: ALLEGRA Take 180 mg by mouth daily.   sildenafil 20 MG tablet Commonly known  as: REVATIO Take 2-5 tablets by mouth 30 minutes prior to intercourse as needed.   TURMERIC PO Take 1,400 mg by mouth daily.       Allergies: No Known Allergies  Family History: Family History  Problem Relation Age of Onset  . Hyperlipidemia Mother   . Hypertension Mother   . Depression Mother   . Diabetes Mother   . Arthritis Father   . Hyperlipidemia Father   . Heart disease Father   . Stroke Father   . Depression Brother   . Hyperlipidemia Maternal Grandmother   . Hypertension Maternal Grandmother   . Depression Maternal Grandmother   . Hyperlipidemia Maternal Grandfather   . Stroke Maternal Grandfather   . Hypertension Maternal Grandfather   . Hyperlipidemia Paternal Grandmother   . Stroke Paternal Grandmother   . Alcohol abuse Paternal Grandfather   . Arthritis Paternal Grandfather   . Hyperlipidemia Paternal Grandfather   . Hypertension Paternal Grandfather     Social History:  reports that he has quit smoking. He has never used smokeless tobacco. He reports current alcohol use. He reports that he does not use drugs.  ROS: UROLOGY Frequent Urination?: No Hard to postpone urination?: No Burning/pain with urination?: No Get up at night to urinate?: No Leakage of urine?: No Urine stream starts and stops?: No Trouble starting  stream?: No Do you have to strain to urinate?: No Blood in urine?: No Urinary tract infection?: No Sexually transmitted disease?: No Injury to kidneys or bladder?: No Painful intercourse?: No Weak stream?: No Erection problems?: Yes Penile pain?: No  Gastrointestinal Nausea?: No Vomiting?: No Indigestion/heartburn?: No Diarrhea?: Yes Constipation?: Yes  Constitutional Fever: No Night sweats?: No Weight loss?: No Fatigue?: No  Skin Skin rash/lesions?: No Itching?: No  Eyes Blurred vision?: No Double vision?: No  Ears/Nose/Throat Sore throat?: No Sinus problems?: No  Hematologic/Lymphatic Swollen glands?: No  Easy bruising?: No  Cardiovascular Leg swelling?: No Chest pain?: No  Respiratory Cough?: No Shortness of breath?: No  Endocrine Excessive thirst?: No  Musculoskeletal Back pain?: No Joint pain?: No  Neurological Headaches?: No Dizziness?: No  Psychologic Depression?: No Anxiety?: No  Physical Exam: BP 138/88 (BP Location: Left Arm, Patient Position: Sitting, Cuff Size: Normal)   Pulse 73   Ht 5\' 11"  (1.803 m)   Wt 258 lb 12.8 oz (117.4 kg)   BMI 36.10 kg/m   Constitutional:  Alert and oriented, No acute distress. HEENT: [place] AT, moist mucus membranes.  Trachea midline, no masses. Cardiovascular: No clubbing, cyanosis, or edema. Respiratory: Normal respiratory effort, no increased work of breathing. GI: Abdomen is soft, nontender, nondistended, no abdominal masses GU: Phallus circumcised without lesions or plaques.  Testes descended bilaterally without masses or tenderness.  Estimated volume 20 cc bilaterally.  Spermatic cord/epididymis palpably normal bilaterally. Lymph: No cervical or inguinal lymphadenopathy. Skin: No rashes, bruises or suspicious lesions. Neurologic: Grossly intact, no focal deficits, moving all 4 extremities. Psychiatric: Normal mood and affect.  Laboratory Data: Lab Results  Component Value Date   WBC 9.2 12/15/2017   HGB 15.7 12/15/2017   HCT 44.3 12/15/2017   MCV 88.8 12/15/2017   PLT 260.0 12/15/2017    Lab Results  Component Value Date   CREATININE 1.13 08/17/2018    Lab Results  Component Value Date   PSA 0.40 08/17/2018    Lab Results  Component Value Date   TESTOSTERONE 342.42 08/17/2018    Lab Results  Component Value Date   HGBA1C 5.1 08/17/2018    Assessment & Plan:    - Erectile dysfunction He has difficulty maintaining an erection but can achieve initial firm erections.  We discussed potential etiologies including psychogenic, venoocclusive disease and hypogonadism.  Recent testosterone level was low  normal and we will check an LH.  If LH low normal could consider a trial of Clomid to see if this makes any difference in his symptoms.  Penile Doppler testing was also discussed however he was informed if he was diagnosed with veno-occlusive disease the treatment would be a venous compression band which he could also try.  He will be notified with his lab results and further recommendations.   Abbie Sons, [place] [address] [place], [place] [address] [phone]

## 2018-09-25 LAB — LUTEINIZING HORMONE: LH: 4.6 m[IU]/mL (ref 1.7–8.6)

## 2018-09-28 ENCOUNTER — Telehealth: Payer: Self-pay

## 2018-09-28 NOTE — Telephone Encounter (Signed)
See my chart encounter.

## 2018-09-28 NOTE — Telephone Encounter (Signed)
-----   Message from Abbie Sons, MD sent at 09/27/2018 11:21 AM EDT ----- LH level was normal

## 2019-01-21 ENCOUNTER — Ambulatory Visit: Payer: BC Managed Care – PPO | Admitting: Primary Care

## 2019-01-21 ENCOUNTER — Other Ambulatory Visit: Payer: Self-pay

## 2019-01-21 VITALS — BP 126/82 | HR 92 | Temp 97.2°F | Wt 258.0 lb

## 2019-01-21 DIAGNOSIS — R2230 Localized swelling, mass and lump, unspecified upper limb: Secondary | ICD-10-CM | POA: Insufficient documentation

## 2019-01-21 DIAGNOSIS — R2232 Localized swelling, mass and lump, left upper limb: Secondary | ICD-10-CM | POA: Diagnosis not present

## 2019-01-21 HISTORY — DX: Localized swelling, mass and lump, unspecified upper limb: R22.30

## 2019-01-21 NOTE — Progress Notes (Signed)
Subjective:    Patient ID: Stephen Sweeney, male    DOB: 1975-08-12, 44 y.o.   MRN: [id number]  HPI  This visit occurred during the SARS-CoV-2 public health emergency.  Safety protocols were in place, including screening questions prior to the visit, additional usage of staff PPE, and extensive cleaning of exam room while observing appropriate contact time as indicated for disinfecting solutions.   Stephen Sweeney is a 44 year old male with a history of sleep apnea, hyperlipidemia, allergic rhinitis who presents today with a chief complaint of skin mass.  His mass is located to the left posterior/lateral forearm subcutaneously for which he initially noticed two weeks ago. Gradually over the last two weeks he's noticed the mass grow in size from a "pea" to a "quarter". Denies pain but does have tenderness with moderate palpation. He denies erythema, increased activity, repetitive movements.   Review of Systems  Musculoskeletal: Negative for myalgias.  Skin: Negative for color change.       Skin mass  Neurological: Negative for weakness and numbness.       Past Medical History:  Diagnosis Date  . Atrial fibrillation (HCC)    once, diagnosed on ECG  . Chickenpox   . Chronic ankle pain   . Heart murmur   . Hyperlipidemia   . IBS (irritable bowel syndrome)    Mixed     Social History   Socioeconomic History  . Marital status: Married    Spouse name: Not on file  . Number of children: Not on file  . Years of education: Not on file  . Highest education level: Not on file  Occupational History  . Not on file  Tobacco Use  . Smoking status: Former Games developer  . Smokeless tobacco: Never Used  Substance and Sexual Activity  . Alcohol use: Yes    Alcohol/week: 0.0 standard drinks    Comment: rarely  . Drug use: No  . Sexual activity: Yes    Birth control/protection: None  Other Topics Concern  . Not on file  Social History Narrative   Married.   2 children.   Works in Consulting civil engineer.   Enjoys  playing video games.   Social Determinants of Health   Financial Resource Strain:   . Difficulty of Paying Living Expenses: Not on file  Food Insecurity:   . Worried About Programme researcher, broadcasting/film/video in the Last Year: Not on file  . Ran Out of Food in the Last Year: Not on file  Transportation Needs:   . Lack of Transportation (Medical): Not on file  . Lack of Transportation (Non-Medical): Not on file  Physical Activity:   . Days of Exercise per Week: Not on file  . Minutes of Exercise per Session: Not on file  Stress:   . Feeling of Stress : Not on file  Social Connections:   . Frequency of Communication with Friends and Family: Not on file  . Frequency of Social Gatherings with Friends and Family: Not on file  . Attends Religious Services: Not on file  . Active Member of Clubs or Organizations: Not on file  . Attends Banker Meetings: Not on file  . Marital Status: Not on file  Intimate Partner Violence:   . Fear of Current or Ex-Partner: Not on file  . Emotionally Abused: Not on file  . Physically Abused: Not on file  . Sexually Abused: Not on file    Past Surgical History:  Procedure Laterality Date  . TONSILLECTOMY  AND ADENOIDECTOMY  1985    Family History  Problem Relation Age of Onset  . Hyperlipidemia Mother   . Hypertension Mother   . Depression Mother   . Diabetes Mother   . Arthritis Father   . Hyperlipidemia Father   . Heart disease Father   . Stroke Father   . Depression Brother   . Hyperlipidemia Maternal Grandmother   . Hypertension Maternal Grandmother   . Depression Maternal Grandmother   . Hyperlipidemia Maternal Grandfather   . Stroke Maternal Grandfather   . Hypertension Maternal Grandfather   . Hyperlipidemia Paternal Grandmother   . Stroke Paternal Grandmother   . Alcohol abuse Paternal Grandfather   . Arthritis Paternal Grandfather   . Hyperlipidemia Paternal Grandfather   . Hypertension Paternal Grandfather     No Known  Allergies  Current Outpatient Medications on File Prior to Visit  Medication Sig Dispense Refill  . CALCIUM PO Take by mouth.    . Cats Claw 500 MG CAPS     . fexofenadine (ALLEGRA) 180 MG tablet Take 180 mg by mouth daily.    . TURMERIC PO Take 1,400 mg by mouth daily.     No current facility-administered medications on file prior to visit.    BP 126/82   Pulse 92   Temp (!) 97.2 F (36.2 C) (Temporal)   Wt 258 lb (117 kg)   SpO2 98%   BMI 35.98 kg/m    Objective:   Physical Exam  Constitutional: He appears well-nourished.  Respiratory: Effort normal.  Skin: Skin is warm and dry. No erythema.  2-2.5 cm rounded soft immobile subcutaneous mass to left mid posterior/lateral forearm. No erythema. Non tender.            Assessment & Plan:

## 2019-01-21 NOTE — Assessment & Plan Note (Signed)
Acute for the last two weeks with what seems like rapid growth. Appears to be benign cyst on exam. Ultrasound pending.

## 2019-01-21 NOTE — Patient Instructions (Signed)
You will be contacted regarding your ultrasound.  Please let us know if you have not been contacted within a few days.  It was a pleasure to see you today!   Ganglion Cyst  A ganglion cyst is a non-cancerous, fluid-filled lump that occurs near a joint or tendon. The cyst grows out of a joint or the lining of a tendon. Ganglion cysts most often develop in the hand or wrist, but they can also develop in the shoulder, elbow, hip, knee, ankle, or foot. Ganglion cysts are ball-shaped or egg-shaped. Their size can range from the size of a pea to larger than a grape. Increased activity may cause the cyst to get bigger because more fluid starts to build up. What are the causes? The exact cause of this condition is not known, but it may be related to:  Inflammation or irritation around the joint.  An injury.  Repetitive movements or overuse.  Arthritis. What increases the risk? You are more likely to develop this condition if:  You are a woman.  You are 45-14 years old. What are the signs or symptoms? The main symptom of this condition is a lump. It most often appears on the hand or wrist. In many cases, there are no other symptoms, but a cyst can sometimes cause:  Tingling.  Pain.  Numbness.  Muscle weakness.  Weak grip.  Less range of motion in a joint. How is this diagnosed? Ganglion cysts are usually diagnosed based on a physical exam. Your health care provider will feel the lump and may shine a light next to it. If it is a ganglion cyst, the light will likely shine through it. Your health care provider may order an X-ray, ultrasound, or MRI to rule out other conditions. How is this treated? Ganglion cysts often go away on their own without treatment. If you have pain or other symptoms, treatment may be needed. Treatment is also needed if the ganglion cyst limits your movement or if it gets infected. Treatment may include:  Wearing a brace or splint on your wrist or  finger.  Taking anti-inflammatory medicine.  Having fluid drained from the lump with a needle (aspiration).  Getting a steroid injected into the joint.  Having surgery to remove the ganglion cyst.  Placing a pad on your shoe or wearing shoes that will not rub against the cyst if it is on your foot. Follow these instructions at home:  Do not press on the ganglion cyst, poke it with a needle, or hit it.  Take over-the-counter and prescription medicines only as told by your health care provider.  If you have a brace or splint: ? Wear it as told by your health care provider. ? Remove it as told by your health care provider. Ask if you need to remove it when you take a shower or a bath.  Watch your ganglion cyst for any changes.  Keep all follow-up visits as told by your health care provider. This is important. Contact a health care provider if:  Your ganglion cyst becomes larger or more painful.  You have pus coming from the lump.  You have weakness or numbness in the affected area.  You have a fever or chills. Get help right away if:  You have a fever and have any of these in the cyst area: ? Increased redness. ? Red streaks. ? Swelling. Summary  A ganglion cyst is a non-cancerous, fluid-filled lump that occurs near a joint or tendon.  Ganglion cysts  most often develop in the hand or wrist, but they can also develop in the shoulder, elbow, hip, knee, ankle, or foot.  Ganglion cysts often go away on their own without treatment. This information is not intended to replace advice given to you by your health care provider. Make sure you discuss any questions you have with your health care provider. Document Revised: 12/06/2016 Document Reviewed: 08/23/2016 Elsevier Patient Education  [place].

## 2019-02-03 ENCOUNTER — Other Ambulatory Visit: Payer: Self-pay

## 2019-02-03 ENCOUNTER — Ambulatory Visit
Admission: RE | Admit: 2019-02-03 | Discharge: 2019-02-03 | Disposition: A | Discharge status: Home / Self Care | Payer: BC Managed Care – PPO | Source: Ambulatory Visit | Attending: Primary Care | Admitting: Primary Care

## 2019-02-03 DIAGNOSIS — R2232 Localized swelling, mass and lump, left upper limb: Secondary | ICD-10-CM

## 2019-02-03 DIAGNOSIS — M7989 Other specified soft tissue disorders: Secondary | ICD-10-CM | POA: Diagnosis not present

## 2019-09-14 ENCOUNTER — Ambulatory Visit: Payer: BC Managed Care – PPO | Attending: Internal Medicine

## 2019-09-14 DIAGNOSIS — Z23 Encounter for immunization: Secondary | ICD-10-CM

## 2019-09-14 NOTE — Progress Notes (Signed)
° °  Covid-19 Vaccination Clinic  Name:  Stephen Sweeney    MRN: [id number] DOB: 25-Apr-1975  09/14/2019  Stephen Sweeney was observed post Covid-19 immunization for 15 minutes without incident. He was provided with Vaccine Information Sheet and instruction to access the V-Safe system.   Stephen Sweeney was instructed to call 911 with any severe reactions post vaccine:  Difficulty breathing   Swelling of face and throat   A fast heartbeat   A bad rash all over body   Dizziness and weakness   Immunizations Administered    Name Date Dose VIS Date Route   Pfizer COVID-19 Vaccine 09/14/2019 10:55 AM 0.3 mL 03/03/2018 Intramuscular   Manufacturer: [employer], [employer]   Lot: [id number]   NDC: [address]-3

## 2019-10-05 ENCOUNTER — Ambulatory Visit: Payer: BC Managed Care – PPO | Attending: Internal Medicine

## 2019-10-05 DIAGNOSIS — Z23 Encounter for immunization: Secondary | ICD-10-CM

## 2019-10-05 NOTE — Progress Notes (Signed)
   Covid-19 Vaccination Clinic  Name:  Stephen Sweeney    MRN: [id number] DOB: 1975/05/02  10/05/2019  [name] was observed post Covid-19 immunization for 15 minutes without incident. He was provided with Vaccine Information Sheet and instruction to access the V-Safe system.   [name] was instructed to call 911 with any severe reactions post vaccine: Marland Kitchen Difficulty breathing  . Swelling of face and throat  . A fast heartbeat  . A bad rash all over body  . Dizziness and weakness   Immunizations Administered    Name Date Dose VIS Date Route   Pfizer COVID-19 Vaccine 10/05/2019  2:27 PM 0.3 mL 03/03/2018 Intramuscular   Manufacturer: [employer], [employer]   Lot: [id number]   NDC: [address]-5

## 2019-10-22 ENCOUNTER — Telehealth: Payer: Self-pay

## 2019-10-22 NOTE — Telephone Encounter (Signed)
Irving Burton at front desk had gotten my chart note that pt had scheduled appt on 10/28/19 for abd pain with cramping. I spoke with pt; for 4 days pt has had upper mid abd pain that alternates between sharp pain and dull pain on and off; pt said the episodes last for short time approx 1 min but they continue to reoccur throughout the day. Pt said had similar symptoms few months ago but only lasted for 1 day. Last had dull pain earlier this morning and last sharp pain was on and off on 10/21/19.pt said nothing he does or eats would indicate a pattern.No fever, no nausea or vomiting.Pt has no covid symptoms,and no known exposure to + covid. Pt did say 2 - 3 days ago he had diarrhea but that is normal for pt because he has IBS. Pt has already scheduled a my chart appt with Allayne Gitelman NP on 10/28/19 at 11 AM which is [facility] Stalling first available appt. I offered pt an appt on 10/25/19 with a different provider but pt said he will keep appt with Mayra Reel NP on 10/28/19 unless pt condition changes or worsens and then pt will call about appt at [facility] with different provider or pt will go to an UC. UC & ED precautions given and pt voiced understanding. FYI to Allayne Gitelman NP.

## 2019-10-22 NOTE — Telephone Encounter (Signed)
Noted, will evaluate on 10/28/2019. Agree with triage recommendations.

## 2019-10-28 ENCOUNTER — Other Ambulatory Visit: Payer: Self-pay

## 2019-10-28 ENCOUNTER — Encounter: Payer: Self-pay | Admitting: Primary Care

## 2019-10-28 ENCOUNTER — Ambulatory Visit: Payer: BC Managed Care – PPO | Admitting: Primary Care

## 2019-10-28 VITALS — BP 126/78 | HR 79 | Temp 98.6°F | Ht 71.0 in | Wt 259.0 lb

## 2019-10-28 DIAGNOSIS — Z23 Encounter for immunization: Secondary | ICD-10-CM

## 2019-10-28 DIAGNOSIS — Z114 Encounter for screening for human immunodeficiency virus [HIV]: Secondary | ICD-10-CM

## 2019-10-28 DIAGNOSIS — Z1159 Encounter for screening for other viral diseases: Secondary | ICD-10-CM | POA: Diagnosis not present

## 2019-10-28 DIAGNOSIS — R1030 Lower abdominal pain, unspecified: Secondary | ICD-10-CM | POA: Insufficient documentation

## 2019-10-28 HISTORY — DX: Lower abdominal pain, unspecified: R10.30

## 2019-10-28 NOTE — Assessment & Plan Note (Addendum)
Began over 1 month ago, 2 episodes and ongoing at this time.  Unclear cause, could be hernia vs bowel disease.   Due to personal history of IBS & family history of crohns will refer to GI for colonoscopy and Ultrasound to rule out hernia.    Agree with assessment and plan. Doreene Nest, NP

## 2019-10-28 NOTE — Progress Notes (Signed)
Subjective:    Patient ID: Stephen Sweeney, male    DOB: [birth year], 44 y.o.   MRN: [id number]  HPI  This visit occurred during the SARS-CoV-2 public health emergency.  Safety protocols were in place, including screening questions prior to the visit, additional usage of staff PPE, and extensive cleaning of exam room while observing appropriate contact time as indicated for disinfecting solutions.   [name] is a 44 year old male with a history of IBS who presents today with a chief complaint of abdominal pain.  His abdominal pain is located to the mid lower abdomen which initially began 2 to 3 months ago.  To 3 months ago he was involved in a stressful situation and noticed cramping to the mid lower abdomen without radiation or other symptoms.  Recently he was on the phone with his Internet provider which caused increased stress, and during the call he noticed a return of the same pain to the mid lower abdomen.  During his most recent episode he denies nausea, diarrhea, constipation, bloody stools, fevers.  The cramping eventually dissipated without intervention.  He has a family history of Crohn's disease in his mother.  He has never had a colonoscopy.  Today he continues to notice the abdominal cramping which more so now feels like a soreness and pulling sensation.  Wt Readings from Last 3 Encounters:  10/28/19 259 lb (117.5 kg)  01/21/19 258 lb (117 kg)  09/24/18 258 lb 12.8 oz (117.4 kg)     Review of Systems  Constitutional: Negative for fever.  Gastrointestinal: Positive for abdominal pain. Negative for blood in stool, nausea and vomiting.  Skin: Negative for color change.       Past Medical History:  Diagnosis Date  . Atrial fibrillation (HCC)    once, diagnosed on ECG  . Chickenpox   . Chronic ankle pain   . Heart murmur   . Hyperlipidemia   . IBS (irritable bowel syndrome)    Mixed     Social History   Socioeconomic History  . Marital status: Married    Spouse  name: Not on file  . Number of children: Not on file  . Years of education: Not on file  . Highest education level: Not on file  Occupational History  . Not on file  Tobacco Use  . Smoking status: Former Games developer  . Smokeless tobacco: Never Used  Substance and Sexual Activity  . Alcohol use: Yes    Alcohol/week: 0.0 standard drinks    Comment: rarely  . Drug use: No  . Sexual activity: Yes    Birth control/protection: None  Other Topics Concern  . Not on file  Social History Narrative   Married.   2 children.   Works in Consulting civil engineer.   Enjoys playing video games.   Social Determinants of Health   Financial Resource Strain:   . Difficulty of Paying Living Expenses: Not on file  Food Insecurity:   . Worried About Programme researcher, broadcasting/film/video in the Last Year: Not on file  . Ran Out of Food in the Last Year: Not on file  Transportation Needs:   . Lack of Transportation (Medical): Not on file  . Lack of Transportation (Non-Medical): Not on file  Physical Activity:   . Days of Exercise per Week: Not on file  . Minutes of Exercise per Session: Not on file  Stress:   . Feeling of Stress : Not on file  Social Connections:   . Frequency of  Communication with Friends and Family: Not on file  . Frequency of Social Gatherings with Friends and Family: Not on file  . Attends Religious Services: Not on file  . Active Member of Clubs or Organizations: Not on file  . Attends Banker Meetings: Not on file  . Marital Status: Not on file  Intimate Partner Violence:   . Fear of Current or Ex-Partner: Not on file  . Emotionally Abused: Not on file  . Physically Abused: Not on file  . Sexually Abused: Not on file    Past Surgical History:  Procedure Laterality Date  . TONSILLECTOMY AND ADENOIDECTOMY  1985    Family History  Problem Relation Age of Onset  . Hyperlipidemia Mother   . Hypertension Mother   . Depression Mother   . Diabetes Mother   . Arthritis Father   . Hyperlipidemia  Father   . Heart disease Father   . Stroke Father   . Depression Brother   . Hyperlipidemia Maternal Grandmother   . Hypertension Maternal Grandmother   . Depression Maternal Grandmother   . Hyperlipidemia Maternal Grandfather   . Stroke Maternal Grandfather   . Hypertension Maternal Grandfather   . Hyperlipidemia Paternal Grandmother   . Stroke Paternal Grandmother   . Alcohol abuse Paternal Grandfather   . Arthritis Paternal Grandfather   . Hyperlipidemia Paternal Grandfather   . Hypertension Paternal Grandfather     No Known Allergies  Current Outpatient Medications on File Prior to Visit  Medication Sig Dispense Refill  . CALCIUM PO Take by mouth.    . Cats Claw 500 MG CAPS     . fexofenadine (ALLEGRA) 180 MG tablet Take 180 mg by mouth daily.    . TURMERIC PO Take 1,400 mg by mouth daily.     No current facility-administered medications on file prior to visit.    BP 126/78   Pulse 79   Temp 98.6 F (37 C) (Temporal)   Ht 5\' 11"  (1.803 m)   Wt 259 lb (117.5 kg)   SpO2 99%   BMI 36.12 kg/m    Objective:   Physical Exam Cardiovascular:     Rate and Rhythm: Normal rate.  Pulmonary:     Effort: Pulmonary effort is normal.  Abdominal:     General: Abdomen is flat.     Palpations: Abdomen is soft. There is no mass.     Tenderness: There is no abdominal tenderness. There is no guarding.     Hernia: No hernia is present.  Neurological:     Mental Status: He is alert.            Assessment & Plan:

## 2019-10-28 NOTE — Patient Instructions (Addendum)
You will be contacted regarding your referral to GI for the colonoscopy.  Please let us know if you have not been contacted within two weeks.   You will be contacted regarding your ultrasound.  Please let us know if you have not been contacted within a few days.   It was a pleasure to see you today!    Influenza (Flu) Vaccine (Inactivated or Recombinant): What You Need to Know 1. Why get vaccinated? Influenza vaccine can prevent influenza (flu). Flu is a contagious disease that spreads around the [place] every year, usually between October and May. Anyone can get the flu, but it is more dangerous for some people. Infants and young children, people 66 years of age and older, pregnant women, and people with certain health conditions or a weakened immune system are at greatest risk of flu complications. Pneumonia, bronchitis, sinus infections and ear infections are examples of flu-related complications. If you have a medical condition, such as heart disease, cancer or diabetes, flu can make it worse. Flu can cause fever and chills, sore throat, muscle aches, fatigue, cough, headache, and runny or stuffy nose. Some people may have vomiting and diarrhea, though this is more common in children than adults. Each year thousands of people in the [place] die from flu, and many more are hospitalized. Flu vaccine prevents millions of illnesses and flu-related visits to the doctor each year. 2. Influenza vaccine CDC recommends everyone 59 months of age and older get vaccinated every flu season. Children 6 months through 2 years of age may need 2 doses during a single flu season. Everyone else needs only 1 dose each flu season. It takes about 2 weeks for protection to develop after vaccination. There are many flu viruses, and they are always changing. Each year a new flu vaccine is made to protect against three or four viruses that are likely to cause disease in the upcoming flu season. Even when the  vaccine doesn't exactly match these viruses, it may still provide some protection. Influenza vaccine does not cause flu. Influenza vaccine may be given at the same time as other vaccines. 3. Talk with your health care provider Tell your vaccine provider if the person getting the vaccine:  Has had an allergic reaction after a previous dose of influenza vaccine, or has any severe, life-threatening allergies.  Has ever had Guillain-Barr Syndrome (also called GBS). In some cases, your health care provider may decide to postpone influenza vaccination to a future visit. People with minor illnesses, such as a cold, may be vaccinated. People who are moderately or severely ill should usually wait until they recover before getting influenza vaccine. Your health care provider can give you more information. 4. Risks of a vaccine reaction  Soreness, redness, and swelling where shot is given, fever, muscle aches, and headache can happen after influenza vaccine.  There may be a very small increased risk of Guillain-Barr Syndrome (GBS) after inactivated influenza vaccine (the flu shot). Young children who get the flu shot along with pneumococcal vaccine (PCV13), and/or DTaP vaccine at the same time might be slightly more likely to have a seizure caused by fever. Tell your health care provider if a child who is getting flu vaccine has ever had a seizure. People sometimes faint after medical procedures, including vaccination. Tell your provider if you feel dizzy or have vision changes or ringing in the ears. As with any medicine, there is a very remote chance of a vaccine causing a severe allergic reaction,  other serious injury, or death. 5. What if there is a serious problem? An allergic reaction could occur after the vaccinated person leaves the clinic. If you see signs of a severe allergic reaction (hives, swelling of the face and throat, difficulty breathing, a fast heartbeat, dizziness, or weakness), call  9-1-1 and get the person to the nearest hospital. For other signs that concern you, call your health care provider. Adverse reactions should be reported to the Vaccine Adverse Event Reporting System (VAERS). Your health care provider will usually file this report, or you can do it yourself. Visit the VAERS website at www.vaers.LAgents.no or call 310-496-3344.VAERS is only for reporting reactions, and VAERS staff do not give medical advice. 6. The National Vaccine Injury Compensation Program The [employer] Vaccine Injury Compensation Program (VICP) is a federal program that was created to compensate people who may have been injured by certain vaccines. Visit the VICP website at SpiritualWord.at or call 385-814-6642 to learn about the program and about filing a claim. There is a time limit to file a claim for compensation. 7. How can I learn more?  Ask your healthcare provider.  Call your local or state health department.  Contact the Centers for Disease Control and Prevention (CDC): ? Call 667-827-0022 (1-800-CDC-INFO) or ? Visit CDC's BiotechRoom.com.cy Vaccine Information Statement (Interim) Inactivated Influenza Vaccine (08/21/2017) This information is not intended to replace advice given to you by your health care provider. Make sure you discuss any questions you have with your health care provider. Document Revised: 04/14/2018 Document Reviewed: 08/25/2017 Elsevier Patient Education  2020 [employer].

## 2019-10-28 NOTE — Progress Notes (Signed)
   Subjective:    Patient ID: Stephen Sweeney, male    DOB: 08-21-1975, 44 y.o.   MRN: [id number]  HPI   This visit occurred during the SARS-CoV-2 public health emergency.  Safety protocols were in place, including screening questions prior to the visit, additional usage of staff PPE, and extensive cleaning of exam room while observing appropriate contact time as indicated for disinfecting solutions.   [name] is a 44 year old male with a history of hyperlipidemia, sleep apnea & allergic rhinitis who presents today with a chief complaint of abdominal cramping.   He has been having muscle cramps in lower abdomen non radiating that first began 2-3 months ago. That episode lasted only 2-3 days and reoccurred 1 week ago. He was arguing on phone with a Internet company when this pain suddenly returned. He does report a lot of stress at work and pain seems to become worse if he is stressed. Describes this has a dull ache with sometimes sharp sudden "pull" in lower abdomen.Currently at this time only has a soreness in lower mid abdomen that feels like a bruise. Denies any nausea, vomiting or new diarrhea or constipation. He does have history of IBS and often has fluctuating diarrhea and constipation. No fever or chills.     BP Readings from Last 3 Encounters:  10/28/19 126/78  01/21/19 126/82  09/24/18 138/88     Review of Systems  Constitutional: Negative.   Respiratory: Negative.  Negative for chest tightness and shortness of breath.   Cardiovascular: Negative.  Negative for chest pain.  Gastrointestinal: Positive for abdominal pain. Negative for blood in stool, nausea, rectal pain and vomiting.  Endocrine: Negative.   Genitourinary: Negative.  Negative for dysuria and flank pain.  Musculoskeletal: Negative.   Skin: Negative.   Neurological: Negative.   Psychiatric/Behavioral: Negative.        Objective:   Physical Exam Constitutional:      Appearance: Normal appearance.    Cardiovascular:     Rate and Rhythm: Normal rate.  Pulmonary:     Effort: Pulmonary effort is normal.     Breath sounds: Normal breath sounds.  Abdominal:     General: Bowel sounds are normal.     Palpations: Abdomen is soft.     Tenderness: There is abdominal tenderness. There is no guarding or rebound.     Hernia: No hernia is present.  Musculoskeletal:        General: Normal range of motion.     Cervical back: Normal range of motion and neck supple.  Skin:    General: Skin is warm and dry.     Capillary Refill: Capillary refill takes less than 2 seconds.  Neurological:     General: No focal deficit present.     Mental Status: He is alert and oriented to person, place, and time.  Psychiatric:        Mood and Affect: Mood normal.        Behavior: Behavior normal.           Assessment & Plan:

## 2019-11-01 ENCOUNTER — Ambulatory Visit
Admission: RE | Admit: 2019-11-01 | Discharge: 2019-11-01 | Disposition: A | Discharge status: Home / Self Care | Payer: BC Managed Care – PPO | Source: Ambulatory Visit | Attending: Primary Care | Admitting: Primary Care

## 2019-11-01 ENCOUNTER — Other Ambulatory Visit: Payer: Self-pay

## 2019-11-01 DIAGNOSIS — R1030 Lower abdominal pain, unspecified: Secondary | ICD-10-CM

## 2019-11-05 ENCOUNTER — Telehealth (INDEPENDENT_AMBULATORY_CARE_PROVIDER_SITE_OTHER): Payer: Self-pay | Admitting: Gastroenterology

## 2019-11-05 DIAGNOSIS — Z1211 Encounter for screening for malignant neoplasm of colon: Secondary | ICD-10-CM

## 2019-11-05 MED ORDER — PEG 3350-KCL-NA BICARB-NACL 420 G PO SOLR: 4000.0000 mL | Freq: Once | ORAL | 0 refills | Status: AC

## 2019-11-05 NOTE — Progress Notes (Signed)
Gastroenterology Pre-Procedure Review  Request Date: Friday 12/10/19 Requesting Physician: Dr. Servando Snare  PATIENT REVIEW QUESTIONS: The patient responded to the following health history questions as indicated:    1. Are you having any GI issues? no 2. Do you have a personal history of Polyps? no 3. Do you have a family history of Colon Cancer or Polyps? no 4. Diabetes Mellitus? no 5. Joint replacements in the past 12 months?no 6. Major health problems in the past 3 months?no 7. Any artificial heart valves, MVP, or defibrillator?no    MEDICATIONS & ALLERGIES:    Patient reports the following regarding taking any anticoagulation/antiplatelet therapy:   Plavix, Coumadin, Eliquis, Xarelto, Lovenox, Pradaxa, Brilinta, or Effient? no Aspirin? no / Patient confirms/reports the following medications:  Current Outpatient Medications  Medication Sig Dispense Refill  . CALCIUM PO Take by mouth.    . Cats Claw 500 MG CAPS     . fexofenadine (ALLEGRA) 180 MG tablet Take 180 mg by mouth daily.    . TURMERIC PO Take 1,400 mg by mouth daily.     No current facility-administered medications for this visit.    Patient confirms/reports the following allergies:  No Known Allergies  No orders of the defined types were placed in this encounter.   AUTHORIZATION INFORMATION Primary Insurance: 1D#: Group #:  Secondary Insurance: 1D#: Group #:  SCHEDULE INFORMATION: Date: 12/10/19 Time: Location:[facility]

## 2019-11-08 ENCOUNTER — Other Ambulatory Visit: Payer: Self-pay

## 2019-11-08 ENCOUNTER — Other Ambulatory Visit: Payer: Self-pay | Admitting: Primary Care

## 2019-11-08 DIAGNOSIS — Z1211 Encounter for screening for malignant neoplasm of colon: Secondary | ICD-10-CM

## 2019-11-08 MED ORDER — PEG 3350-KCL-NA BICARB-NACL 420 G PO SOLR: 4000.0000 mL | Freq: Once | ORAL | 0 refills | Status: AC

## 2019-12-08 ENCOUNTER — Other Ambulatory Visit: Admission: RE | Admit: 2019-12-08 | Payer: BC Managed Care – PPO | Source: Ambulatory Visit

## 2019-12-10 ENCOUNTER — Encounter: Admission: RE | Payer: Self-pay | Source: Home / Self Care

## 2019-12-10 ENCOUNTER — Ambulatory Visit
Admission: RE | Admit: 2019-12-10 | Payer: BC Managed Care – PPO | Source: Home / Self Care | Admitting: Gastroenterology

## 2019-12-10 SURGERY — COLONOSCOPY WITH PROPOFOL
Anesthesia: General

## 2020-11-03 ENCOUNTER — Telehealth: Payer: BC Managed Care – PPO | Admitting: Primary Care

## 2020-11-03 ENCOUNTER — Encounter: Payer: Self-pay | Admitting: Primary Care

## 2020-11-03 VITALS — Ht 71.0 in | Wt 259.0 lb

## 2020-11-03 DIAGNOSIS — R051 Acute cough: Secondary | ICD-10-CM | POA: Diagnosis not present

## 2020-11-03 MED ORDER — HYDROCODONE BIT-HOMATROP MBR 5-1.5 MG/5ML PO SOLN: 5.0000 mL | Freq: Three times a day (TID) | ORAL | 0 refills | Status: DC | PRN

## 2020-11-03 MED ORDER — AZITHROMYCIN 250 MG PO TABS: ORAL_TABLET | ORAL | 0 refills | Status: DC

## 2020-11-03 NOTE — Assessment & Plan Note (Signed)
Persistent, no improvement.   Could still be viral, but given duration of symptoms without improvement, will treat.   Prescription for azithromycin tablets sent to pharmacy.  Prescription for Hycodan cough syrup sent to pharmacy.  Drowsiness precautions provided.  Discussed fluids, rest.  Follow-up as needed

## 2020-11-03 NOTE — Patient Instructions (Signed)
You may take the cough suppressant every 8 hours as needed for cough and rest. Caution this medication contains codeine which may cause drowsiness.   Start Azithromycin antibiotics for infection. Take 2 tablets by mouth today, then 1 tablet daily for 4 additional days.  It was a pleasure to see you today!

## 2020-11-03 NOTE — Progress Notes (Signed)
Patient ID: Stephen Sweeney, male    DOB: November 19, 1975, 45 y.o.   MRN: [id number]  Virtual visit completed through [employer], a video enabled telemedicine application. Due to national recommendations of social distancing due to COVID-19, a virtual visit is felt to be most appropriate for this patient at this time. Reviewed limitations, risks, security and privacy concerns of performing a virtual visit and the availability of in person appointments. I also reviewed that there may be a patient responsible charge related to this service. The patient agreed to proceed.   Patient location: home Provider location: [place] at [facility], office Persons participating in this virtual visit: patient, provider   If any vitals were documented, they were collected by patient at home unless specified below.    Ht 5\' 11"  (1.803 m)   Wt 259 lb (117.5 kg)   BMI 36.12 kg/m    CC: Cough Subjective:   HPI: Stephen Sweeney is a 45 y.o. male presenting on 11/03/2020 for to discuss cough.  Symptoms began 8 days ago with post nasal drip. He then developed itchy eyes, sneezing, fever (100), cough. His drainage remains and his cough has progressed and is constant. He has chest soreness, sore throat from cough. Today he is about the same as he was two days ago, he's not improving.   He tested negative for Covid-19 yesterday. He's tried taking Aleve Cold and Sinus, Robitussin, Nyquil with no improvement in cough. Cough is worse at night.   He denies a history of asthma. Is not a smoker.       Relevant past medical, surgical, family and social history reviewed and updated as indicated. Interim medical history since our last visit reviewed. Allergies and medications reviewed and updated. Outpatient Medications Prior to Visit  Medication Sig Dispense Refill   CALCIUM PO Take by mouth.     Cats Claw 500 MG CAPS      fexofenadine (ALLEGRA) 180 MG tablet Take 180 mg by mouth daily.     TURMERIC PO Take 1,400 mg by  mouth daily.     No facility-administered medications prior to visit.     Per HPI unless specifically indicated in ROS section below Review of Systems  Constitutional:  Positive for fatigue. Negative for fever.  HENT:  Positive for postnasal drip.   Respiratory:  Positive for cough.   Objective:  Ht 5\' 11"  (1.803 m)   Wt 259 lb (117.5 kg)   BMI 36.12 kg/m   Wt Readings from Last 3 Encounters:  11/03/20 259 lb (117.5 kg)  10/28/19 259 lb (117.5 kg)  01/21/19 258 lb (117 kg)       Physical exam: General: Alert and oriented x 3, no distress, does appear tired.  Pulmonary: Speaks in complete sentences without increased work of breathing. Deep, dry cough noted several times during exam.  Psychiatric: Normal mood, thought content, and behavior.     Results for orders placed or performed in visit on 09/24/18  Luteinizing hormone  Result Value Ref Range   LH 4.6 1.7 - 8.6 mIU/mL   Assessment & Plan:   Problem List Items Addressed This Visit       Other   Acute cough - Primary    Persistent, no improvement.   Could still be viral, but given duration of symptoms without improvement, will treat.   Prescription for azithromycin tablets sent to pharmacy.  Prescription for Hycodan cough syrup sent to pharmacy.  Drowsiness precautions provided.  Discussed fluids, rest.  Follow-up  as needed      Relevant Medications   azithromycin (ZITHROMAX) 250 MG tablet   HYDROcodone bit-homatropine (HYCODAN) 5-1.5 MG/5ML syrup     Meds ordered this encounter  Medications   azithromycin (ZITHROMAX) 250 MG tablet    Sig: Take 2 tablets by mouth today, then 1 tablet daily for 4 additional days.    Dispense:  6 tablet    Refill:  0    Order Specific Question:   Supervising Provider    Answer:   BEDSOLE, AMY E [[id number]]   HYDROcodone bit-homatropine (HYCODAN) 5-1.5 MG/5ML syrup    Sig: Take 5 mLs by mouth every 8 (eight) hours as needed for cough.    Dispense:  75 mL    Refill:  0     Order Specific Question:   Supervising Provider    Answer:   BEDSOLE, AMY E [[id number]]   No orders of the defined types were placed in this encounter.   I discussed the assessment and treatment plan with the patient. The patient was provided an opportunity to ask questions and all were answered. The patient agreed with the plan and demonstrated an understanding of the instructions. The patient was advised to call back or seek an in-person evaluation if the symptoms worsen or if the condition fails to improve as anticipated.  Follow up plan:  You may take the cough suppressant every 8 hours as needed for cough and rest. Caution this medication contains codeine which may cause drowsiness.   Start Azithromycin antibiotics for infection. Take 2 tablets by mouth today, then 1 tablet daily for 4 additional days.  It was a pleasure to see you today!   Doreene Nest, NP

## 2021-02-02 ENCOUNTER — Encounter: Payer: BC Managed Care – PPO | Admitting: Primary Care

## 2021-03-16 IMAGING — US US ABDOMEN LIMITED
1 series · 15 of 25 positions shown · non-contrast
Comparison: None.

CLINICAL DATA: Lower abdominal pain, pulling sensation below the
umbilicus for 2 months.

EXAM:
ULTRASOUND ABDOMEN LIMITED

[Series 1: us abdomen limited · 27 acquisitions, 15 frames shown]
[im 1/27]
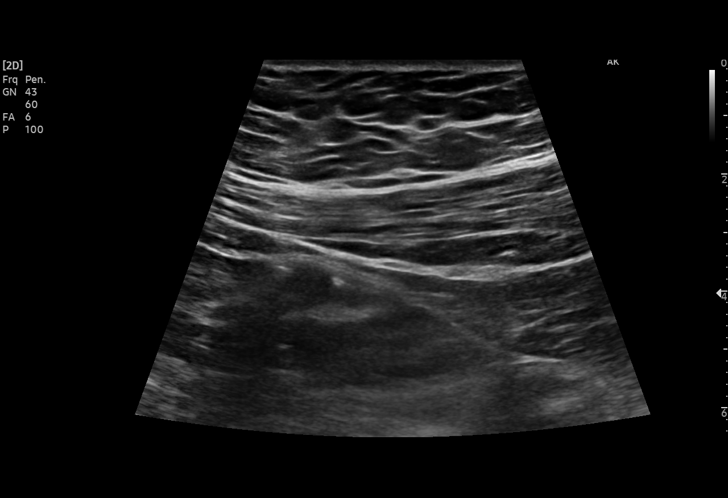
[im 3/27]
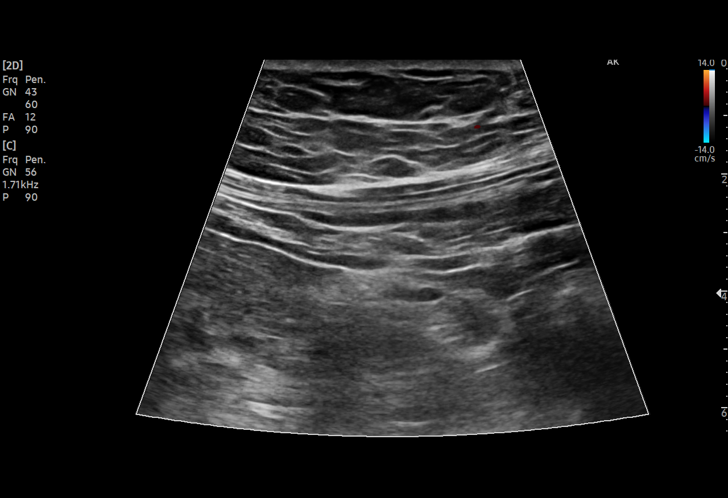
[im 5/27]
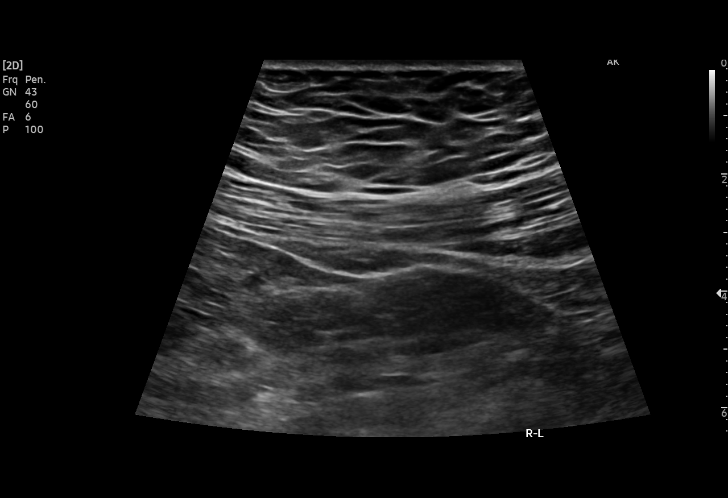
[im 6/27]
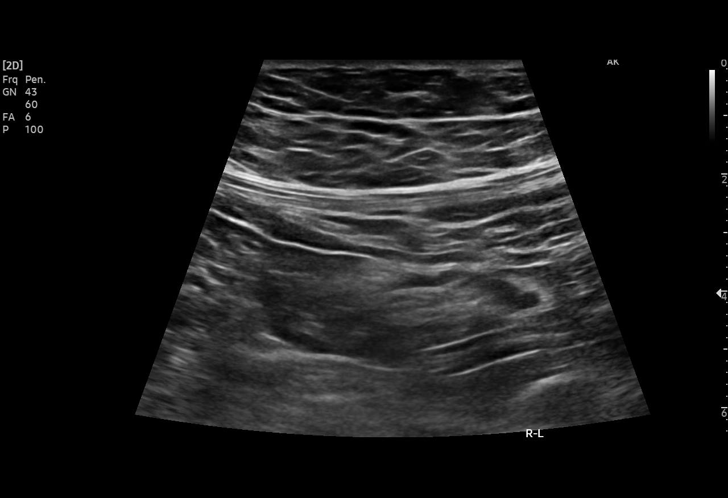
[im 8/27]
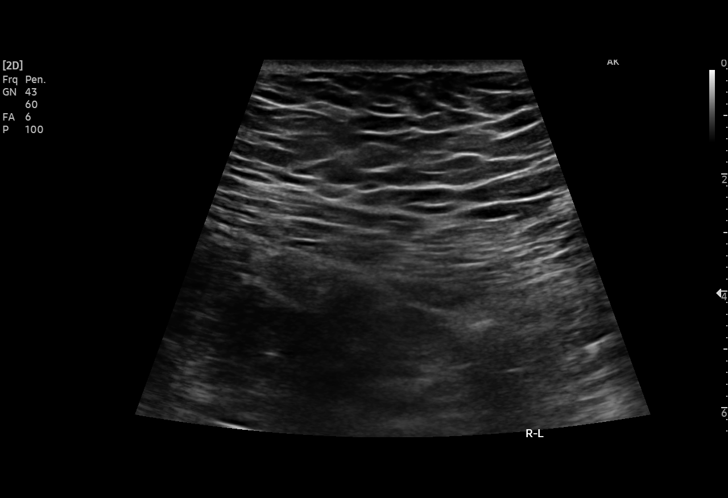
[im 10/27]
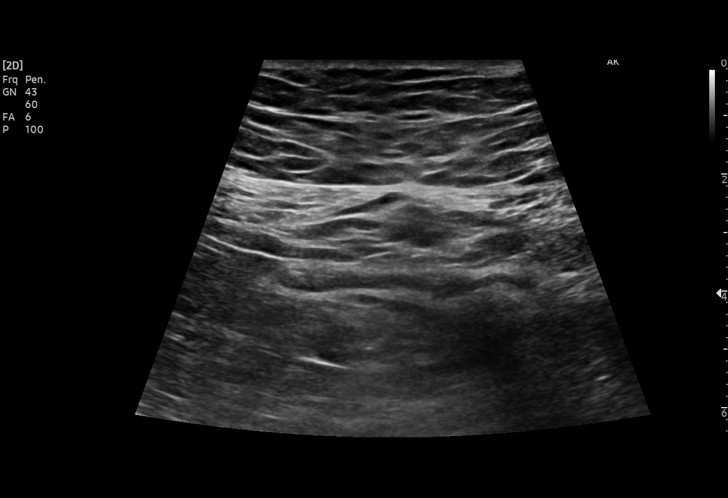
[im 11/27]
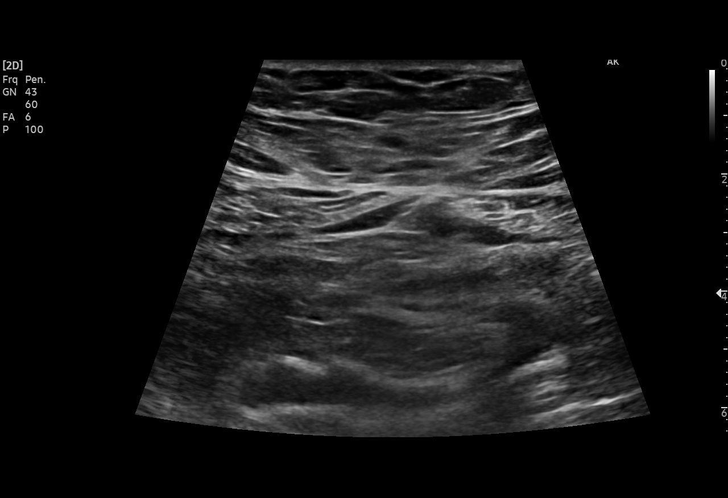
[im 14/27]
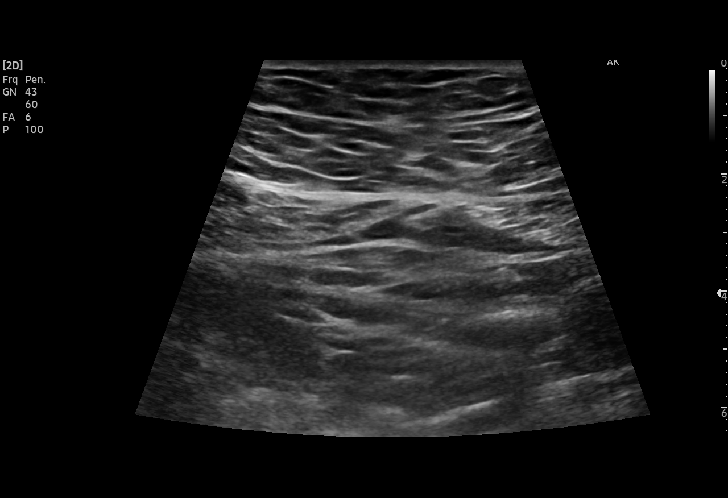
[im 16/27]
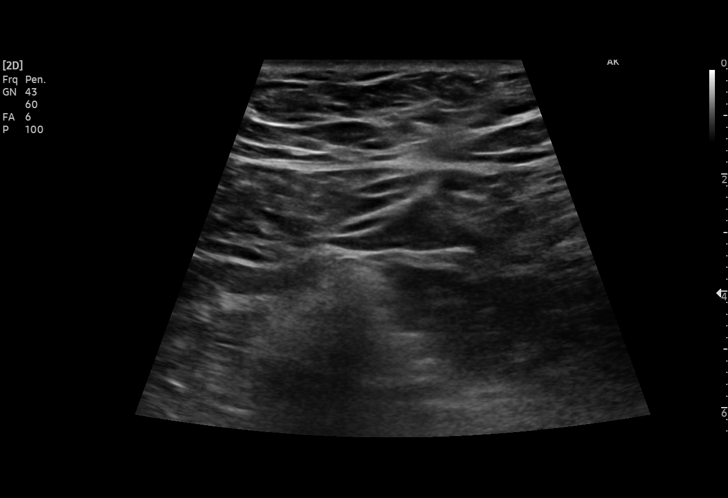
[im 17/27]
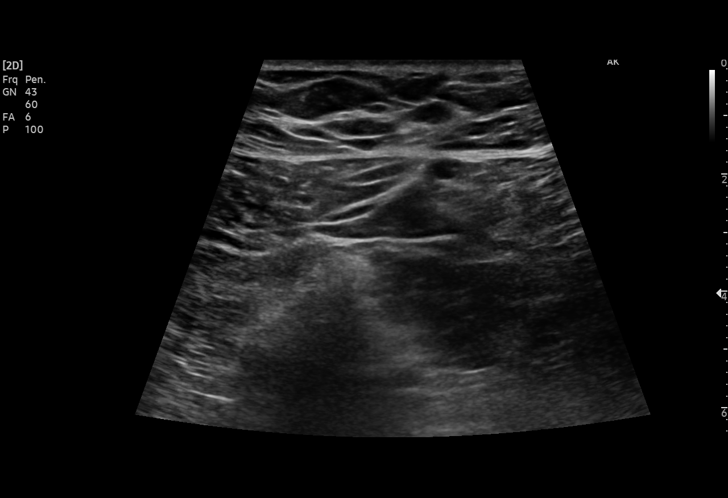
[im 19/27]
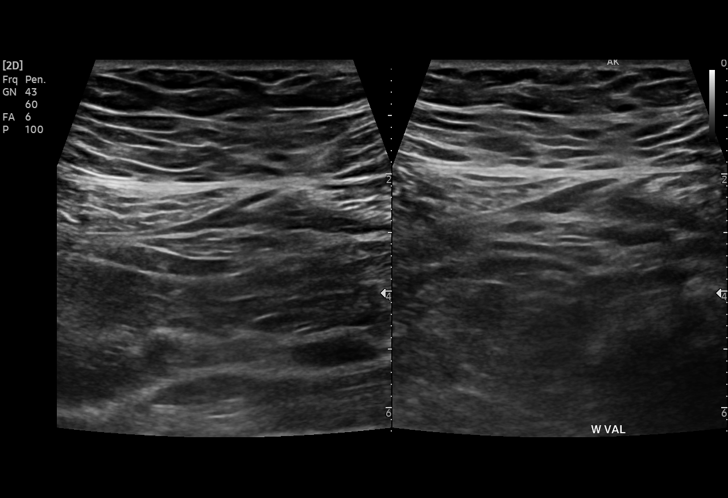
[im 21/27]
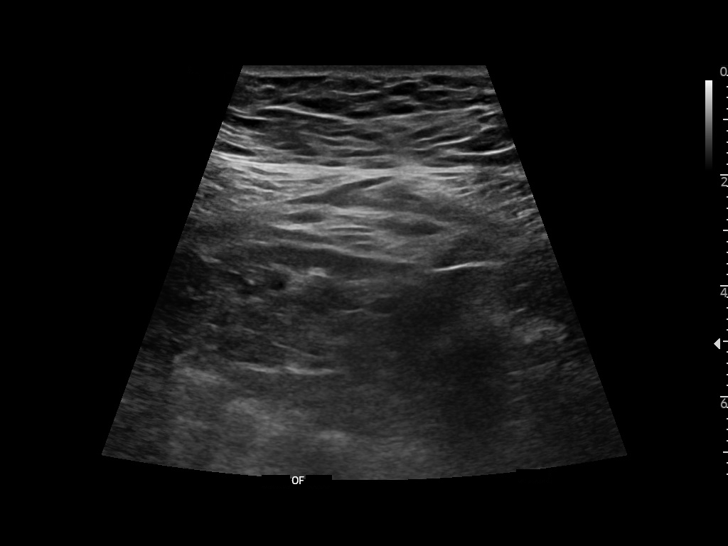
[im 22/27]
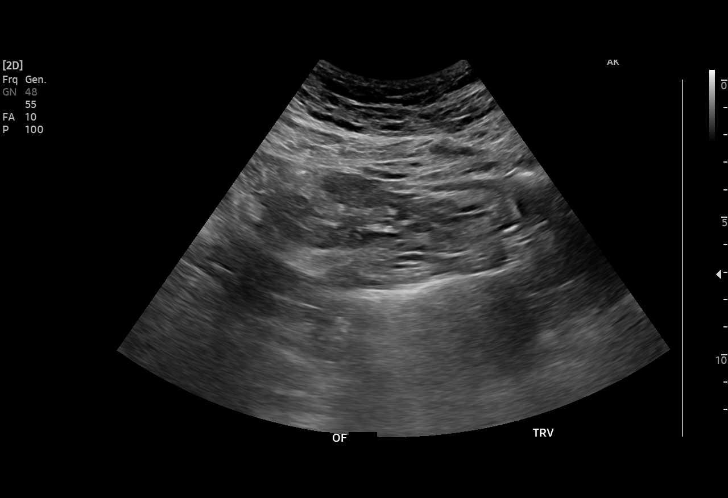
[im 24/27]
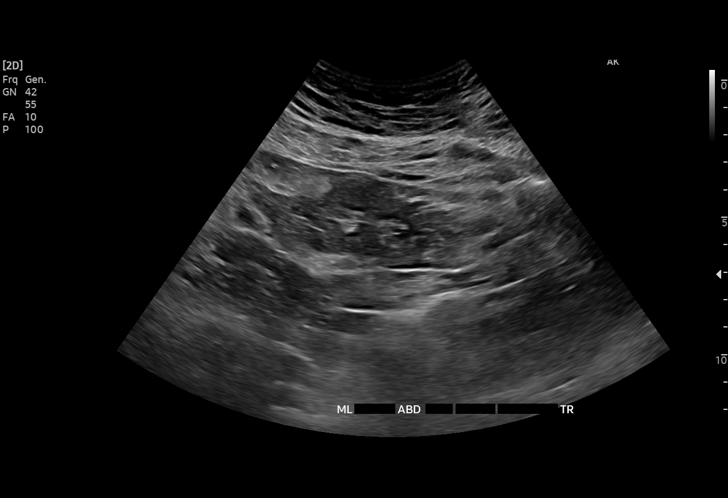
[im 27/27]
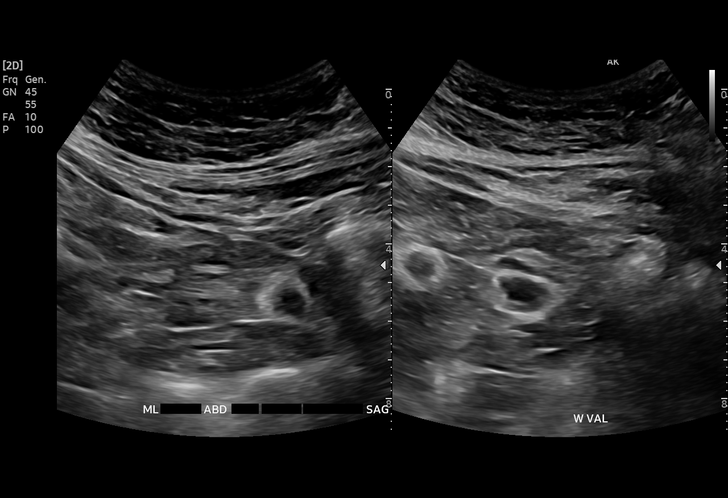

[15 of 25 positions shown; findings below may reference images not displayed]

FINDINGS: Soft tissue ultrasound performed of the area of concern in the
midline inferior to the umbilicus.

No underlying abdominal wall soft tissue mass, cyst, fluid
collection, abscess, hemorrhage or hematoma. No visualized hernia by
ultrasound.
IMPRESSION: No significant abdominal wall finding or hernia by ultrasound in the
area of concern.

## 2021-05-11 ENCOUNTER — Other Ambulatory Visit: Payer: Self-pay | Admitting: Primary Care

## 2021-05-11 DIAGNOSIS — E785 Hyperlipidemia, unspecified: Secondary | ICD-10-CM

## 2021-05-15 ENCOUNTER — Other Ambulatory Visit: Payer: Self-pay | Admitting: Primary Care

## 2021-05-15 DIAGNOSIS — E785 Hyperlipidemia, unspecified: Secondary | ICD-10-CM

## 2021-05-21 ENCOUNTER — Other Ambulatory Visit (INDEPENDENT_AMBULATORY_CARE_PROVIDER_SITE_OTHER): Payer: BC Managed Care – PPO

## 2021-05-21 DIAGNOSIS — E785 Hyperlipidemia, unspecified: Secondary | ICD-10-CM | POA: Diagnosis not present

## 2021-05-21 LAB — CBC
HCT: 42.6 % (ref 39.0–52.0)
Hemoglobin: 15 g/dL (ref 13.0–17.0)
MCHC: 35.1 g/dL (ref 30.0–36.0)
MCV: 88 fl (ref 78.0–100.0)
Platelets: 229 10*3/uL (ref 150.0–400.0)
RBC: 4.85 Mil/uL (ref 4.22–5.81)
RDW: 13.6 % (ref 11.5–15.5)
WBC: 9.5 10*3/uL (ref 4.0–10.5)

## 2021-05-21 LAB — COMPREHENSIVE METABOLIC PANEL
ALT: 49 U/L (ref 0–53)
AST: 24 U/L (ref 0–37)
Albumin: 4.5 g/dL (ref 3.5–5.2)
Alkaline Phosphatase: 64 U/L (ref 39–117)
BUN: 18 mg/dL (ref 6–23)
CO2: 27 mEq/L (ref 19–32)
Calcium: 9.2 mg/dL (ref 8.4–10.5)
Chloride: 104 mEq/L (ref 96–112)
Creatinine, Ser: 1.14 mg/dL (ref 0.40–1.50)
GFR: 76.7 mL/min (ref >60.00)
Glucose, Bld: 118 mg/dL — ABNORMAL HIGH (ref 70–99)
Potassium: 4.2 mEq/L (ref 3.5–5.1)
Sodium: 139 mEq/L (ref 135–145)
Total Bilirubin: 0.7 mg/dL (ref 0.2–1.2)
Total Protein: 7 g/dL (ref 6.0–8.3)

## 2021-05-21 LAB — LIPID PANEL
Cholesterol: 198 mg/dL (ref 0–200)
HDL: 34.5 mg/dL — ABNORMAL LOW (ref >39.00)
LDL Cholesterol: 131 mg/dL — ABNORMAL HIGH (ref 0–99)
NonHDL: 163.76
Total CHOL/HDL Ratio: 6
Triglycerides: 165 mg/dL — ABNORMAL HIGH (ref 0.0–149.0)
VLDL: 33 mg/dL (ref 0.0–40.0)

## 2021-05-21 LAB — HEMOGLOBIN A1C: Hgb A1c MFr Bld: 5.5 % (ref 4.6–6.5)

## 2021-05-24 ENCOUNTER — Ambulatory Visit (INDEPENDENT_AMBULATORY_CARE_PROVIDER_SITE_OTHER): Payer: BC Managed Care – PPO | Admitting: Primary Care

## 2021-05-24 ENCOUNTER — Encounter: Payer: Self-pay | Admitting: Primary Care

## 2021-05-24 ENCOUNTER — Ambulatory Visit (INDEPENDENT_AMBULATORY_CARE_PROVIDER_SITE_OTHER)
Admission: RE | Admit: 2021-05-24 | Discharge: 2021-05-24 | Disposition: A | Discharge status: Home / Self Care | Payer: BC Managed Care – PPO | Source: Ambulatory Visit | Attending: Primary Care | Admitting: Primary Care

## 2021-05-24 VITALS — BP 122/80 | HR 86 | Ht 72.0 in | Wt 262.2 lb

## 2021-05-24 DIAGNOSIS — M25512 Pain in left shoulder: Secondary | ICD-10-CM | POA: Insufficient documentation

## 2021-05-24 DIAGNOSIS — Z0000 Encounter for general adult medical examination without abnormal findings: Secondary | ICD-10-CM | POA: Diagnosis not present

## 2021-05-24 DIAGNOSIS — G4730 Sleep apnea, unspecified: Secondary | ICD-10-CM | POA: Diagnosis not present

## 2021-05-24 DIAGNOSIS — Z87898 Personal history of other specified conditions: Secondary | ICD-10-CM

## 2021-05-24 DIAGNOSIS — E785 Hyperlipidemia, unspecified: Secondary | ICD-10-CM

## 2021-05-24 MED ORDER — SCOPOLAMINE 1 MG/3DAYS TD PT72: 1.0000 | MEDICATED_PATCH | TRANSDERMAL | 0 refills | Status: DC

## 2021-05-24 NOTE — Assessment & Plan Note (Addendum)
Immunizations UTD. Insurance will not cover colonoscopy until age 47.  Discussed the importance of a healthy diet and regular exercise in order for weight loss, and to reduce the risk of further co-morbidity.  Labs reviewed. Exam today stable

## 2021-05-24 NOTE — Patient Instructions (Signed)
Complete xray(s) prior to leaving today. I will notify you of your results once received.  It's important to improve your diet by reducing consumption of fast food, fried food, processed snack foods, sugary drinks. Increase consumption of fresh vegetables and fruits, whole grains, water.  Ensure you are drinking 64 ounces of water daily.  Start exercising. You should be getting 150 minutes of moderate intensity exercise weekly.  It was a pleasure to see you today!  Preventive Care 35-66 Years Old, Male Preventive care refers to lifestyle choices and visits with your health care provider that can promote health and wellness. Preventive care visits are also called wellness exams. What can I expect for my preventive care visit? Counseling During your preventive care visit, your health care provider may ask about your: Medical history, including: Past medical problems. Family medical history. Current health, including: Emotional well-being. Home life and relationship well-being. Sexual activity. Lifestyle, including: Alcohol, nicotine or tobacco, and drug use. Access to firearms. Diet, exercise, and sleep habits. Safety issues such as seatbelt and bike helmet use. Sunscreen use. Work and work Astronomer. Physical exam Your health care provider will check your: Height and weight. These may be used to calculate your BMI (body mass index). BMI is a measurement that tells if you are at a healthy weight. Waist circumference. This measures the distance around your waistline. This measurement also tells if you are at a healthy weight and may help predict your risk of certain diseases, such as type 2 diabetes and high blood pressure. Heart rate and blood pressure. Body temperature. Skin for abnormal spots. What immunizations do I need?  Vaccines are usually given at various ages, according to a schedule. Your health care provider will recommend vaccines for you based on your age, medical  history, and lifestyle or other factors, such as travel or where you work. What tests do I need? Screening Your health care provider may recommend screening tests for certain conditions. This may include: Lipid and cholesterol levels. Diabetes screening. This is done by checking your blood sugar (glucose) after you have not eaten for a while (fasting). Hepatitis B test. Hepatitis C test. HIV (human immunodeficiency virus) test. STI (sexually transmitted infection) testing, if you are at risk. Lung cancer screening. Prostate cancer screening. Colorectal cancer screening. Talk with your health care provider about your test results, treatment options, and if necessary, the need for more tests. Follow these instructions at home: Eating and drinking  Eat a diet that includes fresh fruits and vegetables, whole grains, lean protein, and low-fat dairy products. Take vitamin and mineral supplements as recommended by your health care provider. Do not drink alcohol if your health care provider tells you not to drink. If you drink alcohol: Limit how much you have to 0-2 drinks a day. Know how much alcohol is in your drink. In the [place] one drink equals one 12 oz bottle of beer (355 mL), one 5 oz glass of wine (148 mL), or one 1 oz glass of hard liquor (44 mL). Lifestyle Brush your teeth every morning and night with fluoride toothpaste. Floss one time each day. Exercise for at least 30 minutes 5 or more days each week. Do not use any products that contain nicotine or tobacco. These products include cigarettes, chewing tobacco, and vaping devices, such as e-cigarettes. If you need help quitting, ask your health care provider. Do not use drugs. If you are sexually active, practice safe sex. Use a condom or other form of protection to prevent  STIs. Take aspirin only as told by your health care provider. Make sure that you understand how much to take and what form to take. Work with your health care  provider to find out whether it is safe and beneficial for you to take aspirin daily. Find healthy ways to manage stress, such as: Meditation, yoga, or listening to music. Journaling. Talking to a trusted person. Spending time with friends and family. Minimize exposure to UV radiation to reduce your risk of skin cancer. Safety Always wear your seat belt while driving or riding in a vehicle. Do not drive: If you have been drinking alcohol. Do not ride with someone who has been drinking. When you are tired or distracted. While texting. If you have been using any mind-altering substances or drugs. Wear a helmet and other protective equipment during sports activities. If you have firearms in your house, make sure you follow all gun safety procedures. What's next? Go to your health care provider once a year for an annual wellness visit. Ask your health care provider how often you should have your eyes and teeth checked. Stay up to date on all vaccines. This information is not intended to replace advice given to you by your health care provider. Make sure you discuss any questions you have with your health care provider. Document Revised: 06/21/2020 Document Reviewed: 06/21/2020 Elsevier Patient Education  2023 [employer].

## 2021-05-24 NOTE — Assessment & Plan Note (Signed)
Rx provided for scopolamine patches as requested.

## 2021-05-24 NOTE — Assessment & Plan Note (Signed)
Normal ROM and exam today. Xray pending.  Discussed to refrain from lifting for now. Consider PT vs ortho referral if warranted.

## 2021-05-24 NOTE — Assessment & Plan Note (Signed)
Improved with sleeping apparatus.   Continue to monitor.

## 2021-05-24 NOTE — Assessment & Plan Note (Signed)
Above goal on recent labs, discussed with patient today. He is motivated to work on weight loss.

## 2021-05-24 NOTE — Progress Notes (Signed)
Subjective:    Patient ID: Stephen Sweeney, male    DOB: 13-Mar-1975, 45 y.o.   MRN: [id number]  HPI  Stephen Sweeney is a very pleasant 46 y.o. male who presents today for complete physical and follow up of chronic conditions. He would also like to discuss shoulder pain and is requesting motion sickness medication.  1) Chronic Shoulder Pain: Intermittent for years, underwent evaluation years ago in [place] and was told that he has a ligament that will rub across the bone. Over the last week he's noticed pain to the left shoulder with decrease in ROM. He has recently been picking up his small grandchildren often. He stopped picking up his grandchildren and he noticed resolve in his pain and improved ROM. He took Aleve during the time without improvement.   2) History Of Motion Sickness: He will be on a cruise June 1st through June 11th and is requesting motion sickness patches. He's done well historically and would like to have this refilled.   Immunizations: -Tetanus: 2017 -Influenza: Did not complete last season -Covid-19: 2 vaccines  Diet: Fair diet.  Exercise: No regular exercise.  Eye exam: Completes annually  Dental exam: Completes every three months.    Colonoscopy: Never completed, insurance will not cover until age 50.    Review of Systems  Constitutional:  Negative for unexpected weight change.  HENT:  Negative for rhinorrhea.   Respiratory:  Negative for shortness of breath.   Cardiovascular:  Negative for chest pain.  Gastrointestinal:  Negative for constipation and diarrhea.  Genitourinary:  Negative for difficulty urinating.  Musculoskeletal:  Negative for arthralgias and myalgias.  Skin:  Negative for rash.  Allergic/Immunologic: Negative for environmental allergies.  Neurological:  Negative for dizziness and headaches.  Psychiatric/Behavioral:  The patient is not nervous/anxious.         Past Medical History:  Diagnosis Date   Atrial fibrillation (HCC)     once, diagnosed on ECG   Chickenpox    Chronic ankle pain    Heart murmur    Hyperlipidemia    IBS (irritable bowel syndrome)    Mixed    Social History   Socioeconomic History   Marital status: Married    Spouse name: Not on file   Number of children: Not on file   Years of education: Not on file   Highest education level: Not on file  Occupational History   Not on file  Tobacco Use   Smoking status: Former   Smokeless tobacco: Never  Vaping Use   Vaping Use: Never used  Substance and Sexual Activity   Alcohol use: Yes    Alcohol/week: 0.0 standard drinks    Comment: rarely   Drug use: No   Sexual activity: Yes    Birth control/protection: None  Other Topics Concern   Not on file  Social History Narrative   Married.   2 children.   Works in Consulting civil engineer.   Enjoys playing video games.   Social Determinants of Health   Financial Resource Strain: Not on file  Food Insecurity: Not on file  Transportation Needs: Not on file  Physical Activity: Not on file  Stress: Not on file  Social Connections: Not on file  Intimate Partner Violence: Not on file    Past Surgical History:  Procedure Laterality Date   TONSILLECTOMY AND ADENOIDECTOMY  1985    Family History  Problem Relation Age of Onset   Hyperlipidemia Mother    Hypertension Mother    Depression  Mother    Diabetes Mother    Arthritis Father    Hyperlipidemia Father    Heart disease Father    Stroke Father    Depression Brother    Hyperlipidemia Maternal Grandmother    Hypertension Maternal Grandmother    Depression Maternal Grandmother    Hyperlipidemia Maternal Grandfather    Stroke Maternal Grandfather    Hypertension Maternal Grandfather    Hyperlipidemia Paternal Grandmother    Stroke Paternal Grandmother    Alcohol abuse Paternal Grandfather    Arthritis Paternal Grandfather    Hyperlipidemia Paternal Grandfather    Hypertension Paternal Grandfather     No Known Allergies  Current Outpatient  Medications on File Prior to Visit  Medication Sig Dispense Refill   CALCIUM PO Take by mouth.     Cats Claw 500 MG CAPS      fexofenadine (ALLEGRA) 180 MG tablet Take 180 mg by mouth daily.     TURMERIC PO Take 1,400 mg by mouth daily.     No current facility-administered medications on file prior to visit.    BP 122/80   Pulse 86   Ht 6' (1.829 m)   Wt 262 lb 3.2 oz (118.9 kg)   SpO2 96%   BMI 35.56 kg/m  Objective:   Physical Exam HENT:     Right Ear: Tympanic membrane and ear canal normal.     Left Ear: Tympanic membrane and ear canal normal.     Nose: Nose normal.     Right Sinus: No maxillary sinus tenderness or frontal sinus tenderness.     Left Sinus: No maxillary sinus tenderness or frontal sinus tenderness.  Eyes:     Conjunctiva/sclera: Conjunctivae normal.  Neck:     Thyroid: No thyromegaly.     Vascular: No carotid bruit.  Cardiovascular:     Rate and Rhythm: Normal rate and regular rhythm.     Heart sounds: Normal heart sounds.  Pulmonary:     Effort: Pulmonary effort is normal.     Breath sounds: Normal breath sounds. No wheezing or rales.  Abdominal:     General: Bowel sounds are normal.     Palpations: Abdomen is soft.     Tenderness: There is no abdominal tenderness.  Musculoskeletal:        General: Normal range of motion.     Left shoulder: No bony tenderness. Normal range of motion. Normal strength.     Cervical back: Neck supple.  Skin:    General: Skin is warm and dry.  Neurological:     Mental Status: He is alert and oriented to person, place, and time.     Cranial Nerves: No cranial nerve deficit.     Deep Tendon Reflexes: Reflexes are normal and symmetric.  Psychiatric:        Mood and Affect: Mood normal.          Assessment & Plan:      This visit occurred during the SARS-CoV-2 public health emergency.  Safety protocols were in place, including screening questions prior to the visit, additional usage of staff PPE, and  extensive cleaning of exam room while observing appropriate contact time as indicated for disinfecting solutions.

## 2021-11-08 ENCOUNTER — Ambulatory Visit: Payer: BC Managed Care – PPO | Admitting: Primary Care

## 2022-01-02 ENCOUNTER — Telehealth: Payer: BC Managed Care – PPO | Admitting: Physician Assistant

## 2022-01-02 DIAGNOSIS — J019 Acute sinusitis, unspecified: Secondary | ICD-10-CM

## 2022-01-02 DIAGNOSIS — B9689 Other specified bacterial agents as the cause of diseases classified elsewhere: Secondary | ICD-10-CM | POA: Diagnosis not present

## 2022-01-02 MED ORDER — AMOXICILLIN-POT CLAVULANATE 875-125 MG PO TABS: 1.0000 | ORAL_TABLET | Freq: Two times a day (BID) | ORAL | 0 refills | Status: DC

## 2022-01-02 NOTE — Progress Notes (Signed)
I have spent 5 minutes in review of e-visit questionnaire, review and updating patient chart, medical decision making and response to patient.   Sakura Denis Cody Jeromy Borcherding, PA-C    

## 2022-01-02 NOTE — Progress Notes (Signed)

## 2022-01-15 ENCOUNTER — Ambulatory Visit
Admission: EM | Admit: 2022-01-15 | Discharge: 2022-01-15 | Disposition: A | Discharge status: Home / Self Care | Payer: No Typology Code available for payment source | Attending: Emergency Medicine | Admitting: Emergency Medicine

## 2022-01-15 DIAGNOSIS — H6691 Otitis media, unspecified, right ear: Secondary | ICD-10-CM | POA: Diagnosis not present

## 2022-01-15 DIAGNOSIS — J0100 Acute maxillary sinusitis, unspecified: Secondary | ICD-10-CM

## 2022-01-15 MED ORDER — DOXYCYCLINE HYCLATE 100 MG PO CAPS: 100.0000 mg | ORAL_CAPSULE | Freq: Two times a day (BID) | ORAL | 0 refills | Status: AC

## 2022-01-15 MED ORDER — PREDNISONE 10 MG (21) PO TBPK: ORAL_TABLET | Freq: Every day | ORAL | 0 refills | Status: DC

## 2022-01-15 NOTE — ED Provider Notes (Signed)
Stephen Sweeney    CSN: [id number] Arrival date & time: 01/15/22  5465      History   Chief Complaint Chief Complaint  Patient presents with   Cough   Nasal Congestion    HPI Stephen Sweeney is a 47 y.o. male.  Patient presents with right ear pain x 1 day.  He also reports 5 day history of postnasal drip, cough.  He has had similar symptoms intermittently for 1 month.  No fever, chest pain, shortness of breath, vomiting, diarrhea, or other symptoms.  Patient had an e-visit on 01/02/2022; diagnosed with sinusitis; treated with Augmentin; His symptoms improved then returned.  His medical history includes atrial fibrillation, hyperlipidemia, IBS.   The history is provided by the patient and medical records.    Past Medical History:  Diagnosis Date   Atrial fibrillation (HCC)    once, diagnosed on ECG   Chickenpox    Chronic ankle pain    Heart murmur    Hyperlipidemia    IBS (irritable bowel syndrome)    Mixed    Patient Active Problem List   Diagnosis Date Noted   Acute pain of left shoulder 05/24/2021   History of motion sickness 05/24/2021   Lower abdominal pain 10/28/2019   Localized swelling, mass and lump, upper limb 01/21/2019   Erectile dysfunction 08/06/2018   Sleep apnea 12/15/2017   Chronic pain of right ankle 11/07/2017   Allergic rhinitis 05/09/2016   Preventative health care 03/27/2015   Hyperlipidemia 03/27/2015    Past Surgical History:  Procedure Laterality Date   TONSILLECTOMY AND ADENOIDECTOMY  1985       Home Medications    Prior to Admission medications   Medication Sig Start Date End Date Taking? Authorizing Provider  doxycycline (VIBRAMYCIN) 100 MG capsule Take 1 capsule (100 mg total) by mouth 2 (two) times daily for 10 days. 01/15/22 01/25/22 Yes Mickie Bail, NP  predniSONE (STERAPRED UNI-PAK 21 TAB) 10 MG (21) TBPK tablet Take by mouth daily. As directed 01/15/22  Yes Mickie Bail, NP  CALCIUM PO Take by mouth.    [provider]  Cats Claw 500 MG CAPS     [provider]  fexofenadine (ALLEGRA) 180 MG tablet Take 180 mg by mouth daily.    [provider]  scopolamine (TRANSDERM-SCOP) 1 MG/3DAYS Place 1 patch (1.5 mg total) onto the skin every 3 (three) days. 05/24/21   Doreene Nest, NP  TURMERIC PO Take 1,400 mg by mouth daily.    [provider]    Family History Family History  Problem Relation Age of Onset   Hyperlipidemia Mother    Hypertension Mother    Depression Mother    Diabetes Mother    Arthritis Father    Hyperlipidemia Father    Heart disease Father    Stroke Father    Depression Brother    Hyperlipidemia Maternal Grandmother    Hypertension Maternal Grandmother    Depression Maternal Grandmother    Hyperlipidemia Maternal Grandfather    Stroke Maternal Grandfather    Hypertension Maternal Grandfather    Hyperlipidemia Paternal Grandmother    Stroke Paternal Grandmother    Alcohol abuse Paternal Grandfather    Arthritis Paternal Grandfather    Hyperlipidemia Paternal Grandfather    Hypertension Paternal Grandfather     Social History Social History   Tobacco Use   Smoking status: Former   Smokeless tobacco: Never  Building services engineer Use: Never used  Substance Use Topics   Alcohol use: Yes    Alcohol/week: 0.0 standard drinks of alcohol    Comment: rarely   Drug use: No     Allergies   Patient has no known allergies.   Review of Systems Review of Systems  Constitutional:  Negative for chills and fever.  HENT:  Positive for ear discharge and postnasal drip. Negative for congestion, ear pain and sore throat.   Respiratory:  Positive for cough. Negative for shortness of breath.   Cardiovascular:  Negative for chest pain and palpitations.  Gastrointestinal:  Negative for abdominal pain, diarrhea and vomiting.  Skin:  Negative for color change and rash.  All other systems reviewed and are negative.    Physical Exam Triage  Vital Signs ED Triage Vitals  Enc Vitals Group     BP      Pulse      Resp      Temp      Temp src      SpO2      Weight      Height      Head Circumference      Peak Flow      Pain Score      Pain Loc      Pain Edu?      Excl. in GC?    No data found.  Updated Vital Signs BP 122/85   Pulse 100   Temp 98.1 F (36.7 C)   Resp 18   Ht 5\' 11"  (1.803 m)   Wt 260 lb (117.9 kg)   SpO2 98%   BMI 36.26 kg/m   Visual Acuity Right Eye Distance:   Left Eye Distance:   Bilateral Distance:    Right Eye Near:   Left Eye Near:    Bilateral Near:     Physical Exam Vitals and nursing note reviewed.  Constitutional:      General: He is not in acute distress.    Appearance: He is well-developed. He is not ill-appearing.  HENT:     Head: Normocephalic and atraumatic.     Right Ear: Tympanic membrane is erythematous.     Left Ear: Tympanic membrane is not erythematous.     Nose: Nose normal.     Mouth/Throat:     Mouth: Mucous membranes are moist.     Pharynx: Oropharynx is clear.     Comments: Clear PND. Cardiovascular:     Rate and Rhythm: Normal rate and regular rhythm.     Heart sounds: Normal heart sounds.  Pulmonary:     Effort: Pulmonary effort is normal. No respiratory distress.     Breath sounds: Normal breath sounds.  Musculoskeletal:     Cervical back: Neck supple.  Skin:    General: Skin is warm and dry.  Neurological:     Mental Status: He is alert.  Psychiatric:        Mood and Affect: Mood normal.        Behavior: Behavior normal.      UC Treatments / Results  Labs (all labs ordered are listed, but only abnormal results are displayed) Labs Reviewed - No data to display  EKG   Radiology No results found.  Procedures Procedures (including critical care time)  Medications Ordered in UC Medications - No data to display  Initial Impression / Assessment and Plan / UC Course  I have reviewed the triage vital signs and the nursing  notes.  Pertinent labs & imaging results that  were available during my care of the patient were reviewed by me and considered in my medical decision making (see chart for details).   Right otitis media, acute sinusitis.  Patient was recently treated with 7 day course of Augmentin through an e-visit.  Treating today with prednisone taper and 10 day course of doxycycline.  Discussed symptomatic treatment including Tylenol, rest, hydration.  Instructed patient to follow up with his PCP.  Education provided on otitis media and sinusitis.  He agrees to plan of care.    Final Clinical Impressions(s) / UC Diagnoses   Final diagnoses:  Right otitis media, unspecified otitis media type  Acute non-recurrent maxillary sinusitis     Discharge Instructions      Take the doxycycline and prednisone as directed.  Follow up with your primary care provider.        ED Prescriptions     Medication Sig Dispense Auth. Provider   predniSONE (STERAPRED UNI-PAK 21 TAB) 10 MG (21) TBPK tablet Take by mouth daily. As directed 21 tablet Sharion Balloon, NP   doxycycline (VIBRAMYCIN) 100 MG capsule Take 1 capsule (100 mg total) by mouth 2 (two) times daily for 10 days. 20 capsule Sharion Balloon, NP      PDMP not reviewed this encounter.   Sharion Balloon, NP 01/15/22 [phone]

## 2022-01-15 NOTE — Discharge Instructions (Addendum)
Take the doxycycline and prednisone as directed.  Follow-up with your primary care provider.     

## 2022-01-15 NOTE — ED Triage Notes (Addendum)
Patient to Urgent Care with complaints of nasal drainage, throat irritation, productive cough, and shortness of breath. Reports possible low grade fevers.  Symptoms started 3-4 weeks ago. Resolved one week into symptoms then returned. Completed an e-visit and prescribed amoxicillin x7 days. Symptoms then returned again on Thursday.   Initially using saline flushes, calcium/ zinc.

## 2022-02-01 ENCOUNTER — Ambulatory Visit: Payer: No Typology Code available for payment source | Admitting: Primary Care

## 2022-02-01 ENCOUNTER — Encounter: Payer: Self-pay | Admitting: Primary Care

## 2022-02-01 VITALS — BP 122/78 | HR 85 | Temp 98.2°F | Ht 71.0 in | Wt 263.0 lb

## 2022-02-01 DIAGNOSIS — J3089 Other allergic rhinitis: Secondary | ICD-10-CM

## 2022-02-01 MED ORDER — LEVOCETIRIZINE DIHYDROCHLORIDE 5 MG PO TABS: 5.0000 mg | ORAL_TABLET | Freq: Every evening | ORAL | 0 refills | Status: DC

## 2022-02-01 NOTE — Progress Notes (Signed)
Subjective:    Patient ID: Stephen Sweeney, male    DOB: 1975-07-15, 47 y.o.   MRN: [id number]  HPI  Stephen Sweeney is a very pleasant 47 y.o. male with a history of sleep apnea, allergic rhinitis who presents today to discuss nasal congestion.   Symptom onset with rhinorrhea since late November/early December 2023. He completed some saline flushes and noticed improvement. 1-2 weeks later symptoms returned so he continued saline flushes.   Evaluated through an E-Visit on 01/02/22, diagnosed with sinusitis and treated with Augmentin 875-125 mg BID x 7 days.   Evaluated at Urgent Care on 01/15/22 for a 1 day history of right otalgia, 5 day history of post nasal drip and cough. He was diagnosed with acute otitis media and acute sinusitis, treated with 10 day course of prednisone and Doxycycline.   He felt temporarily improved with the Augmentin, but after completing the antibiotics his symptoms returned. His symptoms improved some while taking Doxycycline but resumed as soon as he completed his course.  Symptoms now include rhinorrhea, post nasal drip, right ear pressure. He denies chest and nasal congestion unless he lays down at night. He denies cough except for with post nasal drip. He does not feel sick.   He's taking Flonase once daily, Allegra 180 mg daily. He has been taking Allegra for years. He does not use Afrin.   Review of Systems  Constitutional:  Negative for chills, fatigue and fever.  HENT:  Positive for postnasal drip and rhinorrhea. Negative for congestion, sinus pressure, sinus pain and sore throat.   Respiratory:  Negative for cough and shortness of breath.   Cardiovascular:  Negative for chest pain.         Past Medical History:  Diagnosis Date   Atrial fibrillation ([place])    once, diagnosed on ECG   Chickenpox    Chronic ankle pain    Heart murmur    Hyperlipidemia    IBS (irritable bowel syndrome)    Mixed    Social History   Socioeconomic History    Marital status: Married    Spouse name: Not on file   Number of children: Not on file   Years of education: Not on file   Highest education level: Not on file  Occupational History   Not on file  Tobacco Use   Smoking status: Former   Smokeless tobacco: Never  Vaping Use   Vaping Use: Never used  Substance and Sexual Activity   Alcohol use: Yes    Alcohol/week: 0.0 standard drinks of alcohol    Comment: rarely   Drug use: No   Sexual activity: Yes    Birth control/protection: None  Other Topics Concern   Not on file  Social History Narrative   Married.   2 children.   Works in Engineer, technical sales.   Enjoys playing video games.   Social Determinants of Health   Financial Resource Strain: Not on file  Food Insecurity: Not on file  Transportation Needs: Not on file  Physical Activity: Not on file  Stress: Not on file  Social Connections: Not on file  Intimate Partner Violence: Not on file    Past Surgical History:  Procedure Laterality Date   TONSILLECTOMY AND ADENOIDECTOMY  1985    Family History  Problem Relation Age of Onset   Hyperlipidemia Mother    Hypertension Mother    Depression Mother    Diabetes Mother    Arthritis Father    Hyperlipidemia Father  Heart disease Father    Stroke Father    Depression Brother    Hyperlipidemia Maternal Grandmother    Hypertension Maternal Grandmother    Depression Maternal Grandmother    Hyperlipidemia Maternal Grandfather    Stroke Maternal Grandfather    Hypertension Maternal Grandfather    Hyperlipidemia Paternal Grandmother    Stroke Paternal Grandmother    Alcohol abuse Paternal Grandfather    Arthritis Paternal Grandfather    Hyperlipidemia Paternal Grandfather    Hypertension Paternal Grandfather     No Known Allergies  Current Outpatient Medications on File Prior to Visit  Medication Sig Dispense Refill   ascorbic acid (VITAMIN C) 500 MG tablet Take 500 mg by mouth daily.     CALCIUM PO Take by mouth.     Cats  Claw 500 MG CAPS      Multiple Vitamins-Minerals (ZINC PO) Take by mouth.     Omega-3 Fatty Acids (FISH OIL) 1000 MG CAPS Take by mouth.     TURMERIC PO Take 1,400 mg by mouth daily.     No current facility-administered medications on file prior to visit.    BP 122/78   Pulse 85   Temp 98.2 F (36.8 C) (Temporal)   Ht 5\' 11"  (1.803 m)   Wt 263 lb (119.3 kg)   SpO2 99%   BMI 36.68 kg/m  Objective:   Physical Exam Constitutional:      Appearance: He is not ill-appearing.  HENT:     Right Ear: Tympanic membrane and ear canal normal.     Left Ear: Tympanic membrane and ear canal normal.     Nose: No mucosal edema.     Right Sinus: No maxillary sinus tenderness or frontal sinus tenderness.     Left Sinus: No maxillary sinus tenderness or frontal sinus tenderness.     Mouth/Throat:     Mouth: Mucous membranes are moist.  Eyes:     Conjunctiva/sclera: Conjunctivae normal.  Cardiovascular:     Rate and Rhythm: Normal rate and regular rhythm.  Pulmonary:     Effort: Pulmonary effort is normal.     Breath sounds: Normal breath sounds. No wheezing or rales.  Musculoskeletal:     Cervical back: Neck supple.  Skin:    General: Skin is warm and dry.           Assessment & Plan:  Non-seasonal allergic rhinitis, unspecified trigger Assessment & Plan: Uncontrolled.  Do suspect his chronic symptoms are allergy related. He does not have an infection, especially given no improvement from two courses of strong antibiotics.  Stop Allegra 180 mg. Start  Xyzal 5 mg daily. Increase Flonase to BID.  Consider carbinoxamine 4 mg 1-2 times daily. He will update.    Orders: -     Levocetirizine Dihydrochloride; Take 1 tablet (5 mg total) by mouth every evening. For allergies.  Dispense: 90 tablet; Refill: 0        Pleas Koch, NP

## 2022-02-01 NOTE — Assessment & Plan Note (Signed)
Uncontrolled.  Do suspect his chronic symptoms are allergy related. He does not have an infection, especially given no improvement from two courses of strong antibiotics.  Stop Allegra 180 mg. Start  Xyzal 5 mg daily. Increase Flonase to BID.  Consider carbinoxamine 4 mg 1-2 times daily. He will update.

## 2022-02-28 ENCOUNTER — Ambulatory Visit: Payer: No Typology Code available for payment source | Admitting: Primary Care

## 2022-02-28 ENCOUNTER — Encounter: Payer: Self-pay | Admitting: Primary Care

## 2022-02-28 VITALS — BP 118/76 | HR 65 | Temp 97.7°F | Ht 71.0 in | Wt 268.0 lb

## 2022-02-28 DIAGNOSIS — R590 Localized enlarged lymph nodes: Secondary | ICD-10-CM | POA: Diagnosis not present

## 2022-02-28 MED ORDER — PREDNISONE 20 MG PO TABS: ORAL_TABLET | ORAL | 0 refills | Status: DC

## 2022-02-28 NOTE — Assessment & Plan Note (Signed)
No other signs or symptoms concerning for infection. Low suspicion for cancerous cause as he is at low risk.  Start prednisone 20 mg tablets. Take 2 tablets by mouth once daily in the morning for 5 days.  Consider labs/imaging if no improvement.  He will update.  Continue Xyzal.

## 2022-02-28 NOTE — Progress Notes (Signed)
Subjective:    Patient ID: Stephen Sweeney, male    DOB: 1975/06/16, 47 y.o.   MRN: [id number]  HPI  [name] is a very pleasant 47 y.o. male with a history of sleep apnea, hyperlipidemia, allergic rhinitis who presents today to discuss swollen lymph node.  Symptom onset two weeks ago with intermittent right sided painful submandibular lymph node swelling. He took naproxen intermittently with improvement and eventual resolve.   Yesterday he began to notice left sided submandibular lymph node swelling with pain. He denies fevers, sore throat, post nasal drip, rhinorrhea, feeling sick. He's doing well on Xyzal for allergies. Yesterday he took Aleve without improvement in pain or swelling.   He has not tested for Covid-19. He is a non smoker.    Review of Systems  Constitutional:  Negative for chills and fever.  HENT:  Negative for ear pain, rhinorrhea, sore throat and trouble swallowing.   Respiratory:  Negative for cough.   Allergic/Immunologic: Positive for environmental allergies.  Hematological:  Positive for adenopathy.         Past Medical History:  Diagnosis Date   Atrial fibrillation ([place])    once, diagnosed on ECG   Chickenpox    Chronic ankle pain    Heart murmur    Hyperlipidemia    IBS (irritable bowel syndrome)    Mixed    Social History   Socioeconomic History   Marital status: Married    Spouse name: Not on file   Number of children: Not on file   Years of education: Not on file   Highest education level: Not on file  Occupational History   Not on file  Tobacco Use   Smoking status: Former   Smokeless tobacco: Never  Vaping Use   Vaping Use: Never used  Substance and Sexual Activity   Alcohol use: Yes    Alcohol/week: 0.0 standard drinks of alcohol    Comment: rarely   Drug use: No   Sexual activity: Yes    Birth control/protection: None  Other Topics Concern   Not on file  Social History Narrative   Married.   2 children.   Works in  Engineer, technical sales.   Enjoys playing video games.   Social Determinants of Health   Financial Resource Strain: Not on file  Food Insecurity: Not on file  Transportation Needs: Not on file  Physical Activity: Not on file  Stress: Not on file  Social Connections: Not on file  Intimate Partner Violence: Not on file    Past Surgical History:  Procedure Laterality Date   TONSILLECTOMY AND ADENOIDECTOMY  1985    Family History  Problem Relation Age of Onset   Hyperlipidemia Mother    Hypertension Mother    Depression Mother    Diabetes Mother    Arthritis Father    Hyperlipidemia Father    Heart disease Father    Stroke Father    Depression Brother    Hyperlipidemia Maternal Grandmother    Hypertension Maternal Grandmother    Depression Maternal Grandmother    Hyperlipidemia Maternal Grandfather    Stroke Maternal Grandfather    Hypertension Maternal Grandfather    Hyperlipidemia Paternal Grandmother    Stroke Paternal Grandmother    Alcohol abuse Paternal Grandfather    Arthritis Paternal Grandfather    Hyperlipidemia Paternal Grandfather    Hypertension Paternal Grandfather     No Known Allergies  Current Outpatient Medications on File Prior to Visit  Medication Sig Dispense Refill   ascorbic  acid (VITAMIN C) 500 MG tablet Take 500 mg by mouth daily.     CALCIUM PO Take by mouth.     Cats Claw 500 MG CAPS      fluticasone (FLONASE) 50 MCG/ACT nasal spray Place 1 spray into both nostrils 2 (two) times daily.     levocetirizine (XYZAL) 5 MG tablet Take 1 tablet (5 mg total) by mouth every evening. For allergies. 90 tablet 0   Multiple Vitamins-Minerals (ZINC PO) Take by mouth.     Omega-3 Fatty Acids (FISH OIL) 1000 MG CAPS Take by mouth.     TURMERIC PO Take 1,400 mg by mouth daily.     No current facility-administered medications on file prior to visit.    BP 118/76   Pulse 65   Temp 97.7 F (36.5 C) (Temporal)   Ht 5' 11"$  (1.803 m)   Wt 268 lb (121.6 kg)   SpO2 98%    BMI 37.38 kg/m  Objective:   Physical Exam Constitutional:      Appearance: He is not ill-appearing.  HENT:     Head:     Salivary Glands: Left salivary gland is not diffusely enlarged or tender.     Right Ear: There is impacted cerumen.     Left Ear: There is impacted cerumen.     Nose: No mucosal edema.     Right Sinus: No maxillary sinus tenderness or frontal sinus tenderness.     Left Sinus: No maxillary sinus tenderness or frontal sinus tenderness.     Mouth/Throat:     Mouth: Mucous membranes are moist.     Pharynx: Oropharynx is clear. No pharyngeal swelling or posterior oropharyngeal erythema.  Eyes:     Conjunctiva/sclera: Conjunctivae normal.  Neck:     Comments: Mild to moderate left lateral submandibular lymph node swelling. Tenderness. Soft.  Cardiovascular:     Rate and Rhythm: Normal rate.  Pulmonary:     Effort: Pulmonary effort is normal.     Breath sounds: Normal breath sounds. No wheezing or rales.  Musculoskeletal:     Cervical back: Neck supple.  Lymphadenopathy:     Cervical: Cervical adenopathy present.  Skin:    General: Skin is warm and dry.           Assessment & Plan:  Lymphadenopathy, submandibular Assessment & Plan: No other signs or symptoms concerning for infection. Low suspicion for cancerous cause as he is at low risk.  Start prednisone 20 mg tablets. Take 2 tablets by mouth once daily in the morning for 5 days.  Consider labs/imaging if no improvement.  He will update.  Continue Xyzal.   Orders: -     predniSONE; Take 2 tablets by mouth once daily in the morning for 5 days.  Dispense: 10 tablet; Refill: 0        Pleas Koch, NP

## 2022-02-28 NOTE — Patient Instructions (Signed)
Start prednisone 20 mg tablets. Take 2 tablets by mouth once daily in the morning for 5 days.  Please update me if no improvement.   It was a pleasure to see you today!

## 2022-04-29 ENCOUNTER — Other Ambulatory Visit: Payer: Self-pay | Admitting: Primary Care

## 2022-04-29 DIAGNOSIS — J3089 Other allergic rhinitis: Secondary | ICD-10-CM

## 2022-08-02 ENCOUNTER — Other Ambulatory Visit: Payer: Self-pay | Admitting: Primary Care

## 2022-08-02 DIAGNOSIS — J3089 Other allergic rhinitis: Secondary | ICD-10-CM

## 2022-10-08 ENCOUNTER — Telehealth: Payer: No Typology Code available for payment source | Admitting: Physician Assistant

## 2022-10-08 DIAGNOSIS — J019 Acute sinusitis, unspecified: Secondary | ICD-10-CM

## 2022-10-08 DIAGNOSIS — B9789 Other viral agents as the cause of diseases classified elsewhere: Secondary | ICD-10-CM

## 2022-10-08 MED ORDER — FLUTICASONE PROPIONATE 50 MCG/ACT NA SUSP: 2.0000 | Freq: Every day | NASAL | 0 refills | Status: AC

## 2022-10-08 MED ORDER — BENZONATATE 100 MG PO CAPS: 100.0000 mg | ORAL_CAPSULE | Freq: Three times a day (TID) | ORAL | 0 refills | Status: DC | PRN

## 2022-10-08 NOTE — Progress Notes (Signed)

## 2022-10-08 NOTE — Progress Notes (Signed)
I have spent 5 minutes in review of e-visit questionnaire, review and updating patient chart, medical decision making and response to patient.   Mia Milan Cody Jacklynn Dehaas, PA-C    

## 2022-10-09 ENCOUNTER — Encounter: Payer: Self-pay | Admitting: Primary Care

## 2022-10-09 ENCOUNTER — Ambulatory Visit: Payer: No Typology Code available for payment source | Admitting: Primary Care

## 2022-10-09 VITALS — BP 122/82 | HR 79 | Temp 97.9°F | Ht 71.0 in | Wt 263.0 lb

## 2022-10-09 DIAGNOSIS — R051 Acute cough: Secondary | ICD-10-CM

## 2022-10-09 MED ORDER — PREDNISONE 20 MG PO TABS: ORAL_TABLET | ORAL | 0 refills | Status: DC

## 2022-10-09 MED ORDER — HYDROCOD POLI-CHLORPHE POLI ER 10-8 MG/5ML PO SUER
5.0000 mL | Freq: Two times a day (BID) | ORAL | 0 refills | Status: DC | PRN
Start: 2022-10-09 — End: 2023-02-04

## 2022-10-09 NOTE — Progress Notes (Signed)
Subjective:    Patient ID: Stephen Sweeney, male    DOB: 1975/10/21, 47 y.o.   MRN: [id number]  Cough Associated symptoms include postnasal drip and rhinorrhea. Pertinent negatives include no chest pain, ear pain, sore throat or shortness of breath. His past medical history is significant for environmental allergies.    Stephen Sweeney is a very pleasant 47 y.o. male with a history of allergic rhinitis, sleep apnea, lymphadenopathy,who presents today to discuss cough.  Symptom onset one week ago with scratchy mouth, rhinorrhea, cough, watery/itchy eyes. He then developed body aches, breaking out in sweats intermittently, difficulty sleeping. He suspects he had influenza, wasn't tested. He did not test for Covid-19 infection. Symptoms improved three days ago.   His remaining symptoms include post nasal drip with cough from drainage. Overall he's feeling much better. He's not sleeping well due to his cough. Last night he sat up in bed, woke up choking with drainage.   He's been taking Nyquil with little improvement. He is compliant to his Flonase and Xyzal all along.   Review of Systems  HENT:  Positive for postnasal drip and rhinorrhea. Negative for congestion, ear pain and sore throat.   Respiratory:  Positive for cough. Negative for shortness of breath.   Cardiovascular:  Negative for chest pain.  Allergic/Immunologic: Positive for environmental allergies.         Past Medical History:  Diagnosis Date   Atrial fibrillation (HCC)    once, diagnosed on ECG   Chickenpox    Chronic ankle pain    Heart murmur    Hyperlipidemia    IBS (irritable bowel syndrome)    Mixed    Social History   Socioeconomic History   Marital status: Married    Spouse name: Not on file   Number of children: Not on file   Years of education: Not on file   Highest education level: Associate degree: occupational, Scientist, product/process development, or vocational program  Occupational History   Not on file  Tobacco Use    Smoking status: Former   Smokeless tobacco: Never  Vaping Use   Vaping status: Never Used  Substance and Sexual Activity   Alcohol use: Yes    Alcohol/week: 0.0 standard drinks of alcohol    Comment: rarely   Drug use: No   Sexual activity: Yes    Birth control/protection: None  Other Topics Concern   Not on file  Social History Narrative   Married.   2 children.   Works in Consulting civil engineer.   Enjoys playing video games.   Social Determinants of Health   Financial Resource Strain: Low Risk  (10/08/2022)   Overall Financial Resource Strain (CARDIA)    Difficulty of Paying Living Expenses: Not hard at all  Food Insecurity: No Food Insecurity (10/08/2022)   Hunger Vital Sign    Worried About Running Out of Food in the Last Year: Never true    Ran Out of Food in the Last Year: Never true  Transportation Needs: No Transportation Needs (10/08/2022)   PRAPARE - Administrator, Civil Service (Medical): No    Lack of Transportation (Non-Medical): No  Physical Activity: Unknown (10/08/2022)   Exercise Vital Sign    Days of Exercise per Week: Patient declined    Minutes of Exercise per Session: Not on file  Stress: No Stress Concern Present (10/08/2022)   [employer] of Occupational Health - Occupational Stress Questionnaire    Feeling of Stress : Not at all  Social Connections: Unknown (  10/08/2022)   Social Connection and Isolation Panel [NHANES]    Frequency of Communication with Friends and Family: Once a week    Frequency of Social Gatherings with Friends and Family: Once a week    Attends Religious Services: Patient declined    Database administrator or Organizations: No    Attends Engineer, structural: Not on file    Marital Status: Married  Catering manager Violence: Not on file    Past Surgical History:  Procedure Laterality Date   TONSILLECTOMY AND ADENOIDECTOMY  1985    Family History  Problem Relation Age of Onset   Hyperlipidemia Mother    Hypertension  Mother    Depression Mother    Diabetes Mother    Arthritis Father    Hyperlipidemia Father    Heart disease Father    Stroke Father    Depression Brother    Hyperlipidemia Maternal Grandmother    Hypertension Maternal Grandmother    Depression Maternal Grandmother    Hyperlipidemia Maternal Grandfather    Stroke Maternal Grandfather    Hypertension Maternal Grandfather    Hyperlipidemia Paternal Grandmother    Stroke Paternal Grandmother    Alcohol abuse Paternal Grandfather    Arthritis Paternal Grandfather    Hyperlipidemia Paternal Grandfather    Hypertension Paternal Grandfather     No Known Allergies  Current Outpatient Medications on File Prior to Visit  Medication Sig Dispense Refill   ascorbic acid (VITAMIN C) 500 MG tablet Take 500 mg by mouth daily.     CALCIUM PO Take by mouth.     Cats Claw 500 MG CAPS      fluticasone (FLONASE) 50 MCG/ACT nasal spray Place 2 sprays into both nostrils daily. 16 g 0   levocetirizine (XYZAL) 5 MG tablet TAKE 1 TABLET BY MOUTH EVERY DAY IN THE EVENING FOR ALLERGIES 90 tablet 0   Multiple Vitamins-Minerals (ZINC PO) Take by mouth.     Omega-3 Fatty Acids (FISH OIL) 1000 MG CAPS Take by mouth.     TURMERIC PO Take 1,400 mg by mouth daily.     No current facility-administered medications on file prior to visit.    BP 122/82   Pulse 79   Temp 97.9 F (36.6 C) (Temporal)   Ht 5\' 11"  (1.803 m)   Wt 263 lb (119.3 kg)   SpO2 99%   BMI 36.68 kg/m  Objective:   Physical Exam Constitutional:      Appearance: He is not ill-appearing.  HENT:     Right Ear: Tympanic membrane and ear canal normal.     Left Ear: Tympanic membrane and ear canal normal.     Nose: No mucosal edema.     Right Sinus: No maxillary sinus tenderness or frontal sinus tenderness.     Left Sinus: No maxillary sinus tenderness or frontal sinus tenderness.     Mouth/Throat:     Mouth: Mucous membranes are moist.  Eyes:     Conjunctiva/sclera: Conjunctivae  normal.  Cardiovascular:     Rate and Rhythm: Normal rate and regular rhythm.  Pulmonary:     Effort: Pulmonary effort is normal.     Breath sounds: Normal breath sounds. No wheezing or rhonchi.     Comments: Dry cough noted a few times during exam Musculoskeletal:     Cervical back: Neck supple.  Skin:    General: Skin is warm and dry.           Assessment & Plan:  Acute  cough Assessment & Plan: Symptoms and presentation today consistent with viral etiology.  Do suspect residual postnasal drip to be contributing to ongoing cough.  Continue Xyzal 5 mg daily, Flonase twice daily.  Start prednisone 20 mg tablets. Take 2 tablets by mouth once daily in the morning for 5 days. Start Tussionex at bedtime as needed.  Drowsiness precautions provided. Start famotidine 20 mg at bedtime for potential reflux induced cough.  He will update if no improvement.  Orders: -     predniSONE; Take 2 tablets by mouth once daily for 5 days.  Dispense: 10 tablet; Refill: 0 -     Hydrocod Poli-Chlorphe Poli ER; Take 5 mLs by mouth every 12 (twelve) hours as needed.  Dispense: 50 mL; Refill: 0        Doreene Nest, NP

## 2022-10-09 NOTE — Assessment & Plan Note (Signed)
Symptoms and presentation today consistent with viral etiology.  Do suspect residual postnasal drip to be contributing to ongoing cough.  Continue Xyzal 5 mg daily, Flonase twice daily.  Start prednisone 20 mg tablets. Take 2 tablets by mouth once daily in the morning for 5 days. Start Tussionex at bedtime as needed.  Drowsiness precautions provided. Start famotidine 20 mg at bedtime for potential reflux induced cough.  He will update if no improvement.

## 2022-10-09 NOTE — Patient Instructions (Signed)
Start prednisone 20 mg tablets. Take 2 tablets by mouth once daily in the morning for 5 days.  You May take the cough medicine at night to help with sleep.  Start famotidine (Pepcid) 20 mg at night for cough.  It was a pleasure to see you today!

## 2022-12-09 ENCOUNTER — Ambulatory Visit
Admission: EM | Admit: 2022-12-09 | Discharge: 2022-12-09 | Disposition: A | Discharge status: Home / Self Care | Payer: No Typology Code available for payment source | Attending: Emergency Medicine | Admitting: Emergency Medicine

## 2022-12-09 ENCOUNTER — Telehealth: Payer: No Typology Code available for payment source | Admitting: Physician Assistant

## 2022-12-09 DIAGNOSIS — T23022A Burn of unspecified degree of single left finger (nail) except thumb, initial encounter: Secondary | ICD-10-CM

## 2022-12-09 DIAGNOSIS — T23229A Burn of second degree of unspecified single finger (nail) except thumb, initial encounter: Secondary | ICD-10-CM

## 2022-12-09 MED ORDER — DOXYCYCLINE HYCLATE 100 MG PO CAPS: 100.0000 mg | ORAL_CAPSULE | Freq: Two times a day (BID) | ORAL | 0 refills | Status: DC

## 2022-12-09 NOTE — Discharge Instructions (Signed)
Evaluated for the burn on your finger and there is most likely bacteria lingering that is causing healing to be delayed, at this time there are no signs of sepsis based on your vital signs and there is no involvement of your joint on exam  Begin doxycycline every morning and every evening for 7 days   Discontinue use of alcohol for cleansing as this can dry out the tissue making it take longer to heal, you may use unscented soap and water, patting and not rubbing and covering with a nonadherent dressing only if risk for becoming dirty  You may take Tylenol or Motrin as needed for pain or for fever  You may follow-up with his urgent care for any concerns regarding healing

## 2022-12-09 NOTE — Progress Notes (Signed)
Because of fever, dizziness, etc along with signs of infected burn, this raises concern for a more significant infection. As such, I feel your condition warrants further evaluation and I recommend that you be seen in a face to face visit.   NOTE: There will be NO CHARGE for this eVisit   If you are having a true medical emergency please call 911.      For an urgent face to face visit, [place] has eight urgent care centers for your convenience:   NEW!! [facility] at [facility] Get Driving Directions [phone] [address] [place], [address]    [facility] at [facility] Get Driving Directions [phone] [address] [place], [place] [address]   [facility] Health Urgent Care Center [facility]) Get Driving Directions [phone] [address] [place], [place] [address]  [facility] Health Urgent Care Center [facility]) Get Driving Directions [phone] [address] [place],  [place]  [address]  [facility] Health Urgent Care Center [facility] - at [employer]  [phone] [address] [place],  [place] [address]   [facility] Health Urgent Care at [facility] Get Driving Directions [phone] [address] [place], [place] [address]   [facility] Health Urgent Care at [facility] Get Driving Directions  [phone] [address] [place], [place] [address]   [facility] Health Urgent Care at [facility] Directions [phone] [address] [place], [place] [address]  Your MyChart E-visit questionnaire answers were reviewed by a board certified advanced clinical practitioner to complete your personal care plan based on your specific symptoms.  Thank you for using e-Visits.
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
## 2022-12-10 ENCOUNTER — Ambulatory Visit: Payer: No Typology Code available for payment source

## 2022-12-11 ENCOUNTER — Ambulatory Visit: Payer: No Typology Code available for payment source | Admitting: Primary Care

## 2023-02-04 ENCOUNTER — Ambulatory Visit: Payer: No Typology Code available for payment source | Admitting: Primary Care

## 2023-02-04 ENCOUNTER — Encounter: Payer: Self-pay | Admitting: Primary Care

## 2023-02-04 VITALS — BP 122/84 | HR 76 | Temp 97.9°F | Ht 71.0 in | Wt 274.0 lb

## 2023-02-04 DIAGNOSIS — J3089 Other allergic rhinitis: Secondary | ICD-10-CM | POA: Diagnosis not present

## 2023-02-04 MED ORDER — CARBINOXAMINE MALEATE 4 MG PO TABS: 1.0000 | ORAL_TABLET | Freq: Two times a day (BID) | ORAL | 0 refills | Status: DC

## 2023-02-04 NOTE — Assessment & Plan Note (Signed)
Deteriorated.  Stop Xyzal 5 mg for now.  Start carbinoxamine maleate 4 mg twice daily. Continue Flonase.  He will update in 1 to 2 weeks.  Consider adding Xyzal back to the regimen if warranted. Could also consider the addition of Singulair.

## 2023-02-04 NOTE — Progress Notes (Signed)
Subjective:    Patient ID: Stephen Sweeney, male    DOB: [birth year], 48 y.o.   MRN: [id number]  Sinus Problem Associated symptoms include coughing. Pertinent negatives include no chills, congestion, headaches, sinus pressure or sore throat.    Stephen Sweeney is a very pleasant 48 y.o. male with a history of allergic rhinitis, sleep apnea, acute cough who presents today to discuss chronic post nasal drip.  Chronic history of symptoms which include post nasal drip, rhinorrhea, sore throat, cough, foul smell through the nasal cavity. Symptoms will wax and wane, improved with prednisone and antibiotics at various times. He feels that his cough is secondary to his post nasal drip. He coughs during the day and evening, worse at night when laying down.   Last evaluated for the same symptoms in October 2024. His Xyzal 5 mg and Flonase were continued, prednisone was added.   Of note, he was treated on 12/09/22 at urgent care for an infected burn, initiated on doxycycline 100 mg twice daily x 7 days.  He denies fevers, chills, body aches, feeling sick, esophageal reflux, chest burning.   Review of Systems  Constitutional:  Negative for chills, fatigue and fever.  HENT:  Positive for postnasal drip and rhinorrhea. Negative for congestion, sinus pressure and sore throat.   Respiratory:  Positive for cough.   Neurological:  Negative for headaches.         Past Medical History:  Diagnosis Date   Atrial fibrillation (HCC)    once, diagnosed on ECG   Chickenpox    Chronic ankle pain    Heart murmur    Hyperlipidemia    IBS (irritable bowel syndrome)    Mixed    Social History   Socioeconomic History   Marital status: Married    Spouse name: Not on file   Number of children: Not on file   Years of education: Not on file   Highest education level: Associate degree: occupational, Scientist, product/process development, or vocational program  Occupational History   Not on file  Tobacco Use   Smoking status: Former    Smokeless tobacco: Never  Vaping Use   Vaping status: Never Used  Substance and Sexual Activity   Alcohol use: Yes    Alcohol/week: 0.0 standard drinks of alcohol    Comment: rarely   Drug use: No   Sexual activity: Yes    Birth control/protection: None  Other Topics Concern   Not on file  Social History Narrative   Married.   2 children.   Works in Consulting civil engineer.   Enjoys playing video games.   Social Drivers of Corporate investment banker Strain: Low Risk  (02/03/2023)   Overall Financial Resource Strain (CARDIA)    Difficulty of Paying Living Expenses: Not hard at all  Food Insecurity: No Food Insecurity (02/03/2023)   Hunger Vital Sign    Worried About Running Out of Food in the Last Year: Never true    Ran Out of Food in the Last Year: Never true  Transportation Needs: No Transportation Needs (02/03/2023)   PRAPARE - Administrator, Civil Service (Medical): No    Lack of Transportation (Non-Medical): No  Physical Activity: Insufficiently Active (02/03/2023)   Exercise Vital Sign    Days of Exercise per Week: 3 days    Minutes of Exercise per Session: 10 min  Stress: No Stress Concern Present (02/03/2023)   [employer] of Occupational Health - Occupational Stress Questionnaire    Feeling of Stress :  Not at all  Social Connections: Unknown (02/03/2023)   Social Connection and Isolation Panel [NHANES]    Frequency of Communication with Friends and Family: Once a week    Frequency of Social Gatherings with Friends and Family: Patient declined    Attends Religious Services: Patient declined    Database administrator or Organizations: No    Attends Engineer, structural: Not on file    Marital Status: Married  Catering manager Violence: Not on file    Past Surgical History:  Procedure Laterality Date   TONSILLECTOMY AND ADENOIDECTOMY  1985    Family History  Problem Relation Age of Onset   Hyperlipidemia Mother    Hypertension Mother    Depression  Mother    Diabetes Mother    Arthritis Father    Hyperlipidemia Father    Heart disease Father    Stroke Father    Depression Brother    Hyperlipidemia Maternal Grandmother    Hypertension Maternal Grandmother    Depression Maternal Grandmother    Hyperlipidemia Maternal Grandfather    Stroke Maternal Grandfather    Hypertension Maternal Grandfather    Hyperlipidemia Paternal Grandmother    Stroke Paternal Grandmother    Alcohol abuse Paternal Grandfather    Arthritis Paternal Grandfather    Hyperlipidemia Paternal Grandfather    Hypertension Paternal Grandfather     No Known Allergies  Current Outpatient Medications on File Prior to Visit  Medication Sig Dispense Refill   ascorbic acid (VITAMIN C) 500 MG tablet Take 500 mg by mouth daily.     Cats Claw 500 MG CAPS      levocetirizine (XYZAL) 5 MG tablet TAKE 1 TABLET BY MOUTH EVERY DAY IN THE EVENING FOR ALLERGIES 90 tablet 0   Multiple Vitamins-Minerals (ZINC PO) Take by mouth.     Omega-3 Fatty Acids (FISH OIL) 1000 MG CAPS Take by mouth.     TURMERIC PO Take 1,400 mg by mouth daily.     CALCIUM PO Take by mouth. (Patient not taking: Reported on 02/04/2023)     fluticasone (FLONASE) 50 MCG/ACT nasal spray Place 2 sprays into both nostrils daily. (Patient not taking: Reported on 02/04/2023) 16 g 0   No current facility-administered medications on file prior to visit.    BP 122/84   Pulse 76   Temp 97.9 F (36.6 C) (Temporal)   Ht 5\' 11"  (1.803 m)   Wt 274 lb (124.3 kg)   SpO2 98%   BMI 38.22 kg/m  Objective:   Physical Exam Constitutional:      Appearance: He is not ill-appearing.  HENT:     Right Ear: Tympanic membrane and ear canal normal.     Left Ear: Tympanic membrane and ear canal normal.     Nose: No mucosal edema.     Right Sinus: No maxillary sinus tenderness or frontal sinus tenderness.     Left Sinus: No maxillary sinus tenderness or frontal sinus tenderness.     Mouth/Throat:     Mouth: Mucous  membranes are moist.  Eyes:     Conjunctiva/sclera: Conjunctivae normal.  Cardiovascular:     Rate and Rhythm: Normal rate and regular rhythm.  Pulmonary:     Effort: Pulmonary effort is normal.     Breath sounds: Normal breath sounds. No wheezing or rhonchi.  Musculoskeletal:     Cervical back: Neck supple.  Skin:    General: Skin is warm and dry.  Assessment & Plan:  Non-seasonal allergic rhinitis, unspecified trigger Assessment & Plan: Deteriorated.  Stop Xyzal 5 mg for now.  Start carbinoxamine maleate 4 mg twice daily. Continue Flonase.  He will update in 1 to 2 weeks.  Consider adding Xyzal back to the regimen if warranted. Could also consider the addition of Singulair.  Orders: -     Carbinoxamine Maleate; Take 1 tablet (4 mg total) by mouth 2 (two) times daily. For allergies  Dispense: 180 tablet; Refill: 0        Doreene Nest, NP

## 2023-02-04 NOTE — Patient Instructions (Signed)
Start carbinoxamine 4 mg tablets for allergies.  Take 1 tablet by mouth twice daily.  Hold the Xyzal for now.  Continue Flonase.   Please update me in 2 weeks via MyChart.  It was a pleasure to see you today!

## 2023-02-24 ENCOUNTER — Ambulatory Visit
Admission: RE | Admit: 2023-02-24 | Discharge: 2023-02-24 | Disposition: A | Discharge status: Home / Self Care | Payer: No Typology Code available for payment source | Source: Ambulatory Visit | Attending: Emergency Medicine | Admitting: Emergency Medicine

## 2023-02-24 VITALS — BP 121/81 | HR 102 | Temp 98.2°F | Resp 18

## 2023-02-24 DIAGNOSIS — J0100 Acute maxillary sinusitis, unspecified: Secondary | ICD-10-CM

## 2023-02-24 MED ORDER — AMOXICILLIN-POT CLAVULANATE 875-125 MG PO TABS: 1.0000 | ORAL_TABLET | Freq: Two times a day (BID) | ORAL | 0 refills | Status: DC

## 2023-02-24 NOTE — ED Triage Notes (Signed)
 Patient to Urgent Care with complaints of post nasal drip/ fatigue/ cough/ sinus pressure and headache. Denies any fevers.   Symptoms started five days ago.  Meds: nasal lavage/ no otc meds.

## 2023-02-24 NOTE — ED Provider Notes (Signed)
 Stephen Sweeney    CSN: [id number] Arrival date & time: 02/24/23  2952      History   Chief Complaint Chief Complaint  Patient presents with   Cough    Sinus infection.  Yellow drainage from nose and down throat causing cough.  Been going on for 5 days as of today. - Entered by patient    HPI Stephen Sweeney is a 48 y.o. male.  Patient presents with 5 to 6-day history of sinus pressure, postnasal drip, runny nose, intermittent congestion, cough.  No fever or shortness of breath.  No OTC medications taken.  Patient was seen by his PCP on 02/04/2023 for allergic rhinitis; treated with carbinoxamine.    The history is provided by the patient and medical records.    Past Medical History:  Diagnosis Date   Atrial fibrillation (HCC)    once, diagnosed on ECG   Chickenpox    Chronic ankle pain    Heart murmur    Hyperlipidemia    IBS (irritable bowel syndrome)    Mixed    Patient Active Problem List   Diagnosis Date Noted   Lymphadenopathy, submandibular 02/28/2022   Acute pain of left shoulder 05/24/2021   History of motion sickness 05/24/2021   Lower abdominal pain 10/28/2019   Localized swelling, mass and lump, upper limb 01/21/2019   Erectile dysfunction 08/06/2018   Sleep apnea 12/15/2017   Chronic pain of right ankle 11/07/2017   Allergic rhinitis 05/09/2016   Preventative health care 03/27/2015   Hyperlipidemia 03/27/2015    Past Surgical History:  Procedure Laterality Date   TONSILLECTOMY AND ADENOIDECTOMY  1985       Home Medications    Prior to Admission medications   Medication Sig Start Date End Date Taking? Authorizing Provider  amoxicillin-clavulanate (AUGMENTIN) 875-125 MG tablet Take 1 tablet by mouth every 12 (twelve) hours. 02/24/23  Yes Mickie Bail, NP  ascorbic acid (VITAMIN C) 500 MG tablet Take 500 mg by mouth daily.    [provider]  CALCIUM PO Take by mouth. Patient not taking: Reported on 02/04/2023    [provider]  Carbinoxamine Maleate 4 MG TABS Take 1 tablet (4 mg total) by mouth 2 (two) times daily. For allergies 02/04/23   Doreene Nest, NP  Cats Claw 500 MG CAPS     [provider]  fluticasone (FLONASE) 50 MCG/ACT nasal spray Place 2 sprays into both nostrils daily. Patient not taking: Reported on 02/04/2023 10/08/22   Waldon Merl, PA-C  levocetirizine (XYZAL) 5 MG tablet TAKE 1 TABLET BY MOUTH EVERY DAY IN THE EVENING FOR ALLERGIES Patient not taking: Reported on 02/24/2023 08/02/22   Doreene Nest, NP  Multiple Vitamins-Minerals (ZINC PO) Take by mouth.    [provider]  Omega-3 Fatty Acids (FISH OIL) 1000 MG CAPS Take by mouth.    [provider]  TURMERIC PO Take 1,400 mg by mouth daily.    [provider]    Family History Family History  Problem Relation Age of Onset   Hyperlipidemia Mother    Hypertension Mother    Depression Mother    Diabetes Mother    Arthritis Father    Hyperlipidemia Father    Heart disease Father    Stroke Father    Depression Brother    Hyperlipidemia Maternal Grandmother    Hypertension Maternal Grandmother    Depression Maternal Grandmother    Hyperlipidemia Maternal Grandfather    Stroke Maternal Grandfather  Hypertension Maternal Grandfather    Hyperlipidemia Paternal Grandmother    Stroke Paternal Grandmother    Alcohol abuse Paternal Grandfather    Arthritis Paternal Grandfather    Hyperlipidemia Paternal Grandfather    Hypertension Paternal Grandfather     Social History Social History   Tobacco Use   Smoking status: Former   Smokeless tobacco: Never  Vaping Use   Vaping status: Never Used  Substance Use Topics   Alcohol use: Yes    Alcohol/week: 0.0 standard drinks of alcohol    Comment: rarely   Drug use: No     Allergies   Patient has no known allergies.   Review of Systems Review of Systems  Constitutional:  Negative for chills and fever.  HENT:   Positive for congestion, postnasal drip, rhinorrhea and sinus pressure. Negative for ear pain and sore throat.   Respiratory:  Positive for cough. Negative for shortness of breath.      Physical Exam Triage Vital Signs ED Triage Vitals  Encounter Vitals Group     BP 02/24/23 1006 121/81     Systolic BP Percentile --      Diastolic BP Percentile --      Pulse Rate 02/24/23 1006 (!) 102     Resp 02/24/23 1006 18     Temp 02/24/23 1006 98.2 F (36.8 C)     Temp src --      SpO2 02/24/23 1006 96 %     Weight --      Height --      Head Circumference --      Peak Flow --      Pain Score 02/24/23 1004 2     Pain Loc --      Pain Education --      Exclude from Growth Chart --    No data found.  Updated Vital Signs BP 121/81   Pulse (!) 102   Temp 98.2 F (36.8 C)   Resp 18   SpO2 96%   Visual Acuity Right Eye Distance:   Left Eye Distance:   Bilateral Distance:    Right Eye Near:   Left Eye Near:    Bilateral Near:     Physical Exam Constitutional:      General: He is not in acute distress. HENT:     Right Ear: Tympanic membrane normal.     Left Ear: Tympanic membrane normal.     Nose: Congestion and rhinorrhea present.     Mouth/Throat:     Mouth: Mucous membranes are moist.     Pharynx: Oropharynx is clear.  Cardiovascular:     Rate and Rhythm: Normal rate and regular rhythm.     Heart sounds: Normal heart sounds.  Pulmonary:     Effort: Pulmonary effort is normal. No respiratory distress.     Breath sounds: Normal breath sounds.  Neurological:     Mental Status: He is alert.      UC Treatments / Results  Labs (all labs ordered are listed, but only abnormal results are displayed) Labs Reviewed - No data to display  EKG   Radiology No results found.  Procedures Procedures (including critical care time)  Medications Ordered in UC Medications - No data to display  Initial Impression / Assessment and Plan / UC Course  I have reviewed the  triage vital signs and the nursing notes.  Pertinent labs & imaging results that were available during my care of the patient were reviewed by me and  considered in my medical decision making (see chart for details).    Acute sinusitis.  Lungs are clear and O2 sat is 96% on room air.  Treating with Augmentin.  Education provided on sinusitis.  Discussed symptomatic management including plain Mucinex.  Instructed patient to follow-up with his PCP if he is not improving.  He agrees to plan of care.   Final Clinical Impressions(s) / UC Diagnoses   Final diagnoses:  Acute non-recurrent maxillary sinusitis     Discharge Instructions      Take the Augmentin as directed.  Follow-up with your primary care provider if your symptoms are not improving.      ED Prescriptions     Medication Sig Dispense Auth. Provider   amoxicillin-clavulanate (AUGMENTIN) 875-125 MG tablet Take 1 tablet by mouth every 12 (twelve) hours. 14 tablet Mickie Bail, NP      PDMP not reviewed this encounter.   Mickie Bail, NP 02/24/23 1038

## 2023-02-24 NOTE — Discharge Instructions (Addendum)
 Take the Augmentin as directed.  Follow up with your primary care provider if your symptoms are not improving.

## 2023-05-05 ENCOUNTER — Other Ambulatory Visit: Payer: Self-pay | Admitting: Primary Care

## 2023-05-05 DIAGNOSIS — J3089 Other allergic rhinitis: Secondary | ICD-10-CM

## 2023-05-05 MED ORDER — CARBINOXAMINE MALEATE 4 MG PO TABS: 1.0000 | ORAL_TABLET | Freq: Two times a day (BID) | ORAL | 1 refills | Status: DC

## 2023-10-24 ENCOUNTER — Telehealth: Payer: Self-pay

## 2023-10-24 DIAGNOSIS — E785 Hyperlipidemia, unspecified: Secondary | ICD-10-CM

## 2023-10-24 NOTE — Addendum Note (Signed)
 Addended by: Collie Kittel K on: 10/24/2023 04:50 PM   Modules accepted: Orders

## 2023-10-24 NOTE — Telephone Encounter (Signed)
 Either option is fine.  This is his preference. I have placed lab orders in case he would like to come in for his appointment to have labs drawn.  He will need to fast at least 4 hours.

## 2023-10-24 NOTE — Telephone Encounter (Signed)
 Copied from CRM [id number]. Topic: Clinical - Medical Advice >> Oct 23, 2023  5:56 PM Nessti S wrote: Reason for CRM: patient called because he wanted to know if he is able to get labs before appt on 10/21 or after the appt. Call back number [phone]

## 2023-10-27 NOTE — Telephone Encounter (Signed)
 Called and spoke with patient states he will wait to do labs at his appt tomorrow.

## 2023-10-28 ENCOUNTER — Encounter: Payer: Self-pay | Admitting: Primary Care

## 2023-10-28 ENCOUNTER — Ambulatory Visit (INDEPENDENT_AMBULATORY_CARE_PROVIDER_SITE_OTHER): Admitting: Primary Care

## 2023-10-28 VITALS — BP 126/82 | HR 71 | Temp 97.9°F | Ht 71.0 in | Wt 265.0 lb

## 2023-10-28 DIAGNOSIS — E785 Hyperlipidemia, unspecified: Secondary | ICD-10-CM | POA: Diagnosis not present

## 2023-10-28 DIAGNOSIS — G4730 Sleep apnea, unspecified: Secondary | ICD-10-CM | POA: Diagnosis not present

## 2023-10-28 DIAGNOSIS — Z0000 Encounter for general adult medical examination without abnormal findings: Secondary | ICD-10-CM

## 2023-10-28 DIAGNOSIS — Z23 Encounter for immunization: Secondary | ICD-10-CM

## 2023-10-28 DIAGNOSIS — J3089 Other allergic rhinitis: Secondary | ICD-10-CM

## 2023-10-28 LAB — CBC
HCT: 42.8 % (ref 39.0–52.0)
Hemoglobin: 14.7 g/dL (ref 13.0–17.0)
MCHC: 34.4 g/dL (ref 30.0–36.0)
MCV: 87.8 fl (ref 78.0–100.0)
Platelets: 242 K/uL (ref 150.0–400.0)
RBC: 4.87 Mil/uL (ref 4.22–5.81)
RDW: 13.4 % (ref 11.5–15.5)
WBC: 9.2 K/uL (ref 4.0–10.5)

## 2023-10-28 LAB — COMPREHENSIVE METABOLIC PANEL WITH GFR
ALT: 28 U/L (ref 0–53)
AST: 16 U/L (ref 0–37)
Albumin: 4.8 g/dL (ref 3.5–5.2)
Alkaline Phosphatase: 66 U/L (ref 39–117)
BUN: 15 mg/dL (ref 6–23)
CO2: 28 meq/L (ref 19–32)
Calcium: 9.3 mg/dL (ref 8.4–10.5)
Chloride: 103 meq/L (ref 96–112)
Creatinine, Ser: 1.17 mg/dL (ref 0.40–1.50)
GFR: 73.08 mL/min (ref >60.00)
Glucose, Bld: 99 mg/dL (ref 70–99)
Potassium: 4 meq/L (ref 3.5–5.1)
Sodium: 140 meq/L (ref 135–145)
Total Bilirubin: 0.8 mg/dL (ref 0.2–1.2)
Total Protein: 6.7 g/dL (ref 6.0–8.3)

## 2023-10-28 LAB — LIPID PANEL
Cholesterol: 182 mg/dL (ref 0–200)
HDL: 33.3 mg/dL — ABNORMAL LOW (ref >39.00)
LDL Cholesterol: 115 mg/dL — ABNORMAL HIGH (ref 0–99)
NonHDL: 148.7
Total CHOL/HDL Ratio: 5
Triglycerides: 167 mg/dL — ABNORMAL HIGH (ref 0.0–149.0)
VLDL: 33.4 mg/dL (ref 0.0–40.0)

## 2023-10-28 MED ORDER — CARBINOXAMINE MALEATE 4 MG PO TABS: 1.0000 | ORAL_TABLET | Freq: Two times a day (BID) | ORAL | 3 refills | Status: AC

## 2023-10-28 NOTE — Progress Notes (Signed)
 Subjective:    Patient ID: Stephen Sweeney, male    DOB: [birth year], 48 y.o.   MRN: [id number]  Stephen Sweeney is a very pleasant 48 y.o. male who presents today for complete physical and follow up of chronic conditions.  Immunizations: -Tetanus: Completed in 2017 -Influenza: Influenza vaccine provided today.   Diet: Fair diet.  Exercise: No regular exercise. Active.   Eye exam: Completes annually  Dental exam: Completes semi-annually    Colonoscopy: Never completed. Insurance will not cover until age 53.   BP Readings from Last 3 Encounters:  10/28/23 126/82  02/24/23 121/81  02/04/23 122/84      Review of Systems  Constitutional:  Negative for unexpected weight change.  HENT:  Negative for rhinorrhea.   Respiratory:  Negative for cough and shortness of breath.   Cardiovascular:  Negative for chest pain.  Gastrointestinal:  Negative for constipation and diarrhea.  Genitourinary:  Negative for difficulty urinating.  Musculoskeletal:  Negative for arthralgias and myalgias.  Skin:  Negative for rash.  Allergic/Immunologic: Negative for environmental allergies.  Neurological:  Negative for dizziness and headaches.  Psychiatric/Behavioral:  The patient is not nervous/anxious.          Past Medical History:  Diagnosis Date   Atrial fibrillation (HCC)    once, diagnosed on ECG   Chickenpox    Chronic ankle pain    Heart murmur    Hyperlipidemia    IBS (irritable bowel syndrome)    Mixed   Localized swelling, mass and lump, upper limb 01/21/2019   Lower abdominal pain 10/28/2019    Social History   Socioeconomic History   Marital status: Married    Spouse name: Not on file   Number of children: Not on file   Years of education: Not on file   Highest education level: Associate degree: occupational, Scientist, product/process development, or vocational program  Occupational History   Not on file  Tobacco Use   Smoking status: Former   Smokeless tobacco: Never  Vaping Use   Vaping  status: Never Used  Substance and Sexual Activity   Alcohol use: Yes    Alcohol/week: 0.0 standard drinks of alcohol    Comment: rarely   Drug use: No   Sexual activity: Yes    Birth control/protection: None  Other Topics Concern   Not on file  Social History Narrative   Married.   2 children.   Works in Consulting civil engineer.   Enjoys playing video games.   Social Drivers of Corporate investment banker Strain: Low Risk  (10/25/2023)   Overall Financial Resource Strain (CARDIA)    Difficulty of Paying Living Expenses: Not hard at all  Food Insecurity: No Food Insecurity (10/25/2023)   Hunger Vital Sign    Worried About Running Out of Food in the Last Year: Never true    Ran Out of Food in the Last Year: Never true  Transportation Needs: No Transportation Needs (10/25/2023)   PRAPARE - Administrator, Civil Service (Medical): No    Lack of Transportation (Non-Medical): No  Physical Activity: Inactive (10/25/2023)   Exercise Vital Sign    Days of Exercise per Week: 0 days    Minutes of Exercise per Session: Not on file  Stress: No Stress Concern Present (10/25/2023)   [employer] of Occupational Health - Occupational Stress Questionnaire    Feeling of Stress: Not at all  Social Connections: Moderately Integrated (10/25/2023)   Social Connection and Isolation Panel    Frequency of  Communication with Friends and Family: More than three times a week    Frequency of Social Gatherings with Friends and Family: Once a week    Attends Religious Services: More than 4 times per year    Active Member of [employer] or Organizations: No    Attends Engineer, structural: Not on file    Marital Status: Married  Catering manager Violence: Not on file    Past Surgical History:  Procedure Laterality Date   TONSILLECTOMY AND ADENOIDECTOMY  1985    Family History  Problem Relation Age of Onset   Hyperlipidemia Mother    Hypertension Mother    Depression Mother    Diabetes Mother     Arthritis Father    Hyperlipidemia Father    Heart disease Father    Stroke Father    Depression Brother    Hyperlipidemia Maternal Grandmother    Hypertension Maternal Grandmother    Depression Maternal Grandmother    Hyperlipidemia Maternal Grandfather    Stroke Maternal Grandfather    Hypertension Maternal Grandfather    Hyperlipidemia Paternal Grandmother    Stroke Paternal Grandmother    Alcohol abuse Paternal Grandfather    Arthritis Paternal Grandfather    Hyperlipidemia Paternal Grandfather    Hypertension Paternal Grandfather     No Known Allergies  Current Outpatient Medications on File Prior to Visit  Medication Sig Dispense Refill   ascorbic acid (VITAMIN C) 500 MG tablet Take 500 mg by mouth daily.     Cats Claw 500 MG CAPS      fluticasone  (FLONASE ) 50 MCG/ACT nasal spray Place 2 sprays into both nostrils daily. 16 g 0   levocetirizine (XYZAL ) 5 MG tablet TAKE 1 TABLET BY MOUTH EVERY DAY IN THE EVENING FOR ALLERGIES 90 tablet 0   Multiple Vitamins-Minerals (ZINC PO) Take by mouth.     Omega-3 Fatty Acids (FISH OIL) 1000 MG CAPS Take by mouth.     TURMERIC PO Take 1,400 mg by mouth daily.     No current facility-administered medications on file prior to visit.    BP 126/82   Pulse 71   Temp 97.9 F (36.6 C) (Temporal)   Ht 5' 11 (1.803 m)   Wt 265 lb (120.2 kg)   SpO2 98%   BMI 36.96 kg/m  Objective:   Physical Exam HENT:     Right Ear: Tympanic membrane and ear canal normal.     Left Ear: Tympanic membrane and ear canal normal.  Eyes:     Pupils: Pupils are equal, round, and reactive to light.  Cardiovascular:     Rate and Rhythm: Normal rate and regular rhythm.  Pulmonary:     Effort: Pulmonary effort is normal.     Breath sounds: Normal breath sounds.  Abdominal:     General: Bowel sounds are normal.     Palpations: Abdomen is soft.     Tenderness: There is no abdominal tenderness.  Musculoskeletal:        General: Normal range of  motion.     Cervical back: Neck supple.  Skin:    General: Skin is warm and dry.  Neurological:     Mental Status: He is alert and oriented to person, place, and time.     Cranial Nerves: No cranial nerve deficit.     Deep Tendon Reflexes:     Reflex Scores:      Patellar reflexes are 2+ on the right side and 2+ on the left side. Psychiatric:  Mood and Affect: Mood normal.     Physical Exam        Assessment & Plan:  Preventative health care Assessment & Plan: Immunizations UTD. Influenza vaccine provided today.  Colonoscopy not covered by insurance until age 42.  Discussed the importance of a healthy diet and regular exercise in order for weight loss, and to reduce the risk of further co-morbidity.  Exam stable. Labs pending.  Follow up in 1 year for repeat physical.    Sleep apnea, unspecified type Assessment & Plan: Controlled.  Continue sleeping apparatus.   Non-seasonal allergic rhinitis, unspecified trigger Assessment & Plan: Controlled.  Continue carbinoxamine  4 mg BID.   Orders: -     Carbinoxamine  Maleate; Take 1 tablet (4 mg total) by mouth 2 (two) times daily. For allergies  Dispense: 180 tablet; Refill: 3  Hyperlipidemia, unspecified hyperlipidemia type Assessment & Plan: Repeat lipid panel pending.  Work on a healthy diet and regular exercise in order for weight loss, and to reduce the risk of further co-morbidity.   Orders: -     Lipid panel -     CBC -     Comprehensive metabolic panel with GFR    Assessment and Plan Assessment & Plan         Comer MARLA Gaskins, NP    History of Present Illness

## 2023-10-28 NOTE — Assessment & Plan Note (Signed)
 Controlled.  Continue carbinoxamine  4 mg BID.

## 2023-10-28 NOTE — Assessment & Plan Note (Signed)
 Immunizations UTD. Influenza vaccine provided today.  Colonoscopy not covered by insurance until age 49.  Discussed the importance of a healthy diet and regular exercise in order for weight loss, and to reduce the risk of further co-morbidity.  Exam stable. Labs pending.  Follow up in 1 year for repeat physical.

## 2023-10-28 NOTE — Assessment & Plan Note (Signed)
 Controlled.  Continue sleeping apparatus.

## 2023-10-28 NOTE — Patient Instructions (Signed)
 Stop by the lab prior to leaving today. I will notify you of your results once received.   It was a pleasure to see you today!

## 2023-10-28 NOTE — Assessment & Plan Note (Signed)
 Repeat lipid panel pending.  Work on a healthy diet and regular exercise in order for weight loss, and to reduce the risk of further co-morbidity.

## 2023-10-29 ENCOUNTER — Ambulatory Visit: Payer: Self-pay | Admitting: Primary Care
